# Patient Record
Sex: Male | Born: 2000 | Race: White | Hispanic: No | Marital: Single | Smoking: Current some day smoker
Health system: Southern US, Community
[De-identification: ages and names within clinical notes are randomized; demographics above are authoritative.]

## PROBLEM LIST (undated history)

## (undated) VITALS — BP 121/85 | HR 105 | Temp 97.5°F | Resp 16 | Ht 66.0 in | Wt 152.0 lb

---
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
## 2021-04-25 ENCOUNTER — Encounter (HOSPITAL_COMMUNITY): Payer: Self-pay

## 2021-04-25 ENCOUNTER — Ambulatory Visit (HOSPITAL_COMMUNITY)
Admission: EM | Admit: 2021-04-25 | Discharge: 2021-04-25 | Disposition: A | Discharge status: Home / Self Care | Payer: BC Managed Care – PPO | Attending: Urgent Care | Admitting: Urgent Care

## 2021-04-25 ENCOUNTER — Other Ambulatory Visit: Payer: Self-pay

## 2021-04-25 DIAGNOSIS — R059 Cough, unspecified: Secondary | ICD-10-CM

## 2021-04-25 DIAGNOSIS — J029 Acute pharyngitis, unspecified: Secondary | ICD-10-CM | POA: Insufficient documentation

## 2021-04-25 DIAGNOSIS — J038 Acute tonsillitis due to other specified organisms: Secondary | ICD-10-CM | POA: Insufficient documentation

## 2021-04-25 MED ORDER — NAPROXEN 500 MG PO TABS: 500.0000 mg | ORAL_TABLET | Freq: Two times a day (BID) | ORAL | 0 refills | Status: DC

## 2021-04-25 MED ORDER — AMOXICILLIN-POT CLAVULANATE 875-125 MG PO TABS: 1.0000 | ORAL_TABLET | Freq: Two times a day (BID) | ORAL | 0 refills | Status: DC

## 2021-04-25 MED ORDER — ACETAMINOPHEN 325 MG PO TABS
ORAL_TABLET | ORAL | Status: AC
Start: 2021-04-25 — End: ?
  Filled 2021-04-25: qty 2

## 2021-04-25 MED ORDER — ACETAMINOPHEN 325 MG PO TABS
650.0000 mg | ORAL_TABLET | Freq: Once | ORAL | Status: AC
Start: 2021-04-25 — End: 2021-04-25
  Administered 2021-04-25: 650 mg via ORAL

## 2021-04-25 NOTE — ED Triage Notes (Signed)
Pt present sore throat with enlarge tonsils and white patches on his tonsils. Pt state the fever started two days ago with a cough and then he noticed having trouble swallowing.

## 2021-04-25 NOTE — ED Provider Notes (Addendum)
[facility]   MRN: [id number] DOB: Apr 23, 2001  Subjective:   Stanley Castro is a 20 y.o. male presenting for 2-day history of acute onset severe throat pain that is worsening, has difficulty swallowing and is coughing.  Patient reports seeing swollen tonsils, white spots as well.  Denies chest pain, shortness of breath, fever, body aches.  Does not want to be tested for COVID-19.  Denies history of frequent strep infections or tonsillitis.  No current facility-administered medications for this encounter.  Current Outpatient Medications:    amphetamine-dextroamphetamine (ADDERALL) 10 MG tablet, Take 1 tablet (10 mg total) by mouth 2 (two) times daily., Disp: 60 tablet, Rfl: 0   amphetamine-dextroamphetamine (ADDERALL) 10 MG tablet, Take 1 tablet (10 mg total) by mouth 2 (two) times daily. Ok to fill 03/18/12, Disp: 60 tablet, Rfl: 0   amphetamine-dextroamphetamine (ADDERALL) 10 MG tablet, Take 1 tablet (10 mg total) by mouth 2 (two) times daily., Disp: 60 tablet, Rfl: 0   amphetamine-dextroamphetamine (ADDERALL) 10 MG tablet, Take 1 tablet (10 mg total) by mouth 2 (two) times daily. To fill 06/29/12, Disp: 60 tablet, Rfl: 0   amphetamine-dextroamphetamine (ADDERALL) 10 MG tablet, Take 1 tablet (10 mg total) by mouth 2 (two) times daily. To fill 07/28/12, Disp: 60 tablet, Rfl: 0   guanFACINE 4 MG TB24, Take 1 tablet (4 mg total) by mouth daily., Disp: 30 tablet, Rfl: 5   Melatonin 1 MG CAPS, Take by mouth., Disp: , Rfl:    Methylphenidate HCl (RITALIN PO), Take 1 tablet by mouth daily., Disp: , Rfl:    traZODone (DESYREL) 100 MG tablet, Take 1 tablet (100 mg total) by mouth at bedtime., Disp: 30 tablet, Rfl: 5   No Known Allergies  History reviewed. No pertinent past medical history.   History reviewed. No pertinent surgical history.  Family History  Problem Relation Age of Onset   Healthy Mother    Healthy Father     Social History   Tobacco Use   Smoking  status: Some Days    Types: Cigarettes   Smokeless tobacco: Current    ROS   Objective:   Vitals: BP 136/89 (BP Location: Left Arm)   Pulse (!) 104   Temp 100.1 F (37.8 C) (Oral)   Resp 18   SpO2 100%   Physical Exam Constitutional:      General: He is not in acute distress.    Appearance: Normal appearance. He is well-developed and normal weight. He is not ill-appearing, toxic-appearing or diaphoretic.  HENT:     Head: Normocephalic and atraumatic.     Right Ear: Tympanic membrane, ear canal and external ear normal. There is no impacted cerumen.     Left Ear: Tympanic membrane, ear canal and external ear normal. There is no impacted cerumen.     Nose: Nose normal. No congestion or rhinorrhea.     Mouth/Throat:     Mouth: Mucous membranes are moist.     Pharynx: Pharyngeal swelling, oropharyngeal exudate and posterior oropharyngeal erythema present. No uvula swelling.     Tonsils: Tonsillar exudate present. No tonsillar abscesses. 2+ on the right. 1+ on the left.  Eyes:     General: No scleral icterus.       Right eye: No discharge.        Left eye: No discharge.     Extraocular Movements: Extraocular movements intact.     Conjunctiva/sclera: Conjunctivae normal.     Pupils: Pupils are equal, round, and  reactive to light.  Cardiovascular:     Rate and Rhythm: Normal rate and regular rhythm.     Heart sounds: Normal heart sounds. No murmur heard.   No friction rub. No gallop.  Pulmonary:     Effort: Pulmonary effort is normal. No respiratory distress.     Breath sounds: Normal breath sounds. No stridor. No wheezing, rhonchi or rales.  Musculoskeletal:     Cervical back: Normal range of motion and neck supple. No rigidity. No muscular tenderness.  Neurological:     General: No focal deficit present.     Mental Status: He is alert and oriented to person, place, and time.  Psychiatric:        Mood and Affect: Mood normal.        Behavior: Behavior normal.         Thought Content: Thought content normal.        Judgment: Judgment normal.    Negative rapid strep by verbal confirmation.  Patient given Tylenol for his fever.  Assessment and Plan :   PDMP not reviewed this encounter.  1. Acute tonsillitis due to other specified organisms   2. Sore throat   3. Cough     At this time I do not suspect tonsillar abscess, retropharyngeal abscess.  Airway is not compromised.  Will treat empirically for tonsillitis, pharyngitis given physical exam findings.  Patient is to start Augmentin, use supportive care otherwise. Counseled patient on potential for adverse effects with medications prescribed/recommended today, strict ER and return-to-clinic precautions discussed, patient verbalized understanding.     Wallis Bamberg, PA-C 04/25/21 1122

## 2021-04-27 LAB — POCT RAPID STREP A, ED / UC: Streptococcus, Group A Screen (Direct): NEGATIVE

## 2021-04-27 LAB — CULTURE, GROUP A STREP (THRC)

## 2021-05-08 ENCOUNTER — Ambulatory Visit (HOSPITAL_COMMUNITY)
Admission: EM | Admit: 2021-05-08 | Discharge: 2021-05-08 | Disposition: A | Discharge status: Home / Self Care | Payer: BC Managed Care – PPO | Attending: Medical Oncology | Admitting: Medical Oncology

## 2021-05-08 ENCOUNTER — Encounter (HOSPITAL_COMMUNITY): Payer: Self-pay | Admitting: *Deleted

## 2021-05-08 ENCOUNTER — Other Ambulatory Visit: Payer: Self-pay

## 2021-05-08 DIAGNOSIS — Z23 Encounter for immunization: Secondary | ICD-10-CM | POA: Diagnosis not present

## 2021-05-08 DIAGNOSIS — S61412A Laceration without foreign body of left hand, initial encounter: Secondary | ICD-10-CM | POA: Diagnosis not present

## 2021-05-08 MED ORDER — TETANUS-DIPHTH-ACELL PERTUSSIS 5-2.5-18.5 LF-MCG/0.5 IM SUSY
PREFILLED_SYRINGE | INTRAMUSCULAR | Status: AC
Start: 2021-05-08 — End: ?
  Filled 2021-05-08: qty 0.5

## 2021-05-08 MED ORDER — TETANUS-DIPHTHERIA TOXOIDS TD 5-2 LFU IM INJ: 0.5000 mL | INJECTION | Freq: Once | INTRAMUSCULAR | Status: DC

## 2021-05-08 MED ORDER — LIDOCAINE-EPINEPHRINE 1 %-1:100000 IJ SOLN
INTRAMUSCULAR | Status: AC
  Filled 2021-05-08: qty 1

## 2021-05-08 MED ORDER — DOXYCYCLINE HYCLATE 100 MG PO CAPS: 100.0000 mg | ORAL_CAPSULE | Freq: Two times a day (BID) | ORAL | 0 refills | Status: DC

## 2021-05-08 MED ORDER — TETANUS-DIPHTH-ACELL PERTUSSIS 5-2.5-18.5 LF-MCG/0.5 IM SUSY
0.5000 mL | PREFILLED_SYRINGE | Freq: Once | INTRAMUSCULAR | Status: AC
  Administered 2021-05-08: 0.5 mL via INTRAMUSCULAR

## 2021-05-08 NOTE — Discharge Instructions (Addendum)
Please return in 7 days for suture removal

## 2021-05-08 NOTE — ED Triage Notes (Signed)
Pt cut his Lt hand with box cutter today.

## 2021-05-08 NOTE — ED Provider Notes (Signed)
[facility]    CSN: [id number] Arrival date & time: 05/08/21  1642      History   Chief Complaint Chief Complaint  Patient presents with   Laceration    HPI Stanley Castro is a 20 y.o. male.   HPI  Laceration: Patient reports that about an hour ago he was cutting a box and accidentally cut his left hand with a box cutter.  He states that he did not cut very deep.  There was mild bleeding that lasted about 5 minutes and was able to be continued with pressure.  He denies any numbness, tingling or skin color changes.  He is unsure if he is up-to-date on his Tdap or not.  History reviewed. No pertinent past medical history.  Patient Active Problem List   Diagnosis Date Noted   ADHD (attention deficit hyperactivity disorder) 05/30/2012    History reviewed. No pertinent surgical history.     Home Medications    Prior to Admission medications   Medication Sig Start Date End Date Taking? Authorizing Provider  guanFACINE 4 MG TB24 Take 1 tablet (4 mg total) by mouth daily. 01/19/12  Yes Copland, Gwenlyn Found, MD  Melatonin 1 MG CAPS Take by mouth.   Yes [provider]  naproxen (NAPROSYN) 500 MG tablet Take 1 tablet (500 mg total) by mouth 2 (two) times daily with a meal. 04/25/21  Yes Wallis Bamberg, PA-C  amoxicillin-clavulanate (AUGMENTIN) 875-125 MG tablet Take 1 tablet by mouth every 12 (twelve) hours. 04/25/21   Wallis Bamberg, PA-C  amphetamine-dextroamphetamine (ADDERALL) 10 MG tablet Take 1 tablet (10 mg total) by mouth 2 (two) times daily. 01/19/12   Copland, Gwenlyn Found, MD  amphetamine-dextroamphetamine (ADDERALL) 10 MG tablet Take 1 tablet (10 mg total) by mouth 2 (two) times daily. Ok to fill 03/18/12 01/19/12   Copland, Gwenlyn Found, MD  amphetamine-dextroamphetamine (ADDERALL) 10 MG tablet Take 1 tablet (10 mg total) by mouth 2 (two) times daily. 05/30/12   Copland, Gwenlyn Found, MD  amphetamine-dextroamphetamine (ADDERALL) 10 MG tablet Take 1 tablet (10 mg total) by  mouth 2 (two) times daily. To fill 06/29/12 05/30/12   Copland, Gwenlyn Found, MD  amphetamine-dextroamphetamine (ADDERALL) 10 MG tablet Take 1 tablet (10 mg total) by mouth 2 (two) times daily. To fill 07/28/12 05/30/12   Copland, Gwenlyn Found, MD  Methylphenidate HCl (RITALIN PO) Take 1 tablet by mouth daily.    [provider]  traZODone (DESYREL) 100 MG tablet Take 1 tablet (100 mg total) by mouth at bedtime. 01/19/12   Copland, Gwenlyn Found, MD    Family History Family History  Problem Relation Age of Onset   Healthy Mother    Healthy Father     Social History Social History   Tobacco Use   Smoking status: Some Days    Types: Cigarettes   Smokeless tobacco: Current     Allergies   Patient has no known allergies.   Review of Systems Review of Systems  As stated above in HPI Physical Exam Triage Vital Signs ED Triage Vitals  Enc Vitals Group     BP 05/08/21 1702 110/65     Pulse Rate 05/08/21 1702 82     Resp 05/08/21 1702 18     Temp 05/08/21 1702 98.4 F (36.9 C)     Temp src --      SpO2 05/08/21 1702 98 %     Weight --      Height --  Head Circumference --      Peak Flow --      Pain Score 05/08/21 1658 3     Pain Loc --      Pain Edu? --      Excl. in GC? --    No data found.  Updated Vital Signs BP 110/65   Pulse 82   Temp 98.4 F (36.9 C)   Resp 18   SpO2 98%   Physical Exam Vitals and nursing note reviewed.  Cardiovascular:     Pulses: Normal pulses.  Musculoskeletal:        General: Normal range of motion.  Skin:    General: Skin is warm.     Capillary Refill: Capillary refill takes less than 2 seconds.     Comments: 1 inch laceration of the anatomical snuffbox.   Neurological:     Cranial Nerves: No cranial nerve deficit.     Sensory: No sensory deficit.     Motor: No weakness.     Coordination: Coordination normal.     UC Treatments / Results  Labs (all labs ordered are listed, but only abnormal results are displayed) Labs  Reviewed - No data to display  EKG   Radiology No results found.  Procedures Laceration Repair  Date/Time: 05/08/2021 5:55 PM Performed by: Rushie Chestnut, PA-C Authorized by: Rushie Chestnut, PA-C   Consent:    Consent obtained:  Verbal   Consent given by:  Patient   Risks discussed:  Infection, need for additional repair, pain, poor cosmetic result and poor wound healing   Alternatives discussed:  No treatment and delayed treatment Universal protocol:    Procedure explained and questions answered to patient or proxy's satisfaction: yes     Relevant documents present and verified: yes     Test results available: yes     Imaging studies available: yes     Required blood products, implants, devices, and special equipment available: yes     Site/side marked: yes     Immediately prior to procedure, a time out was called: yes     Patient identity confirmed:  Verbally with patient Anesthesia:    Anesthesia method:  Local infiltration   Local anesthetic:  Lidocaine 1% WITH epi Laceration details:    Location:  Hand   Hand location:  L hand, dorsum   Length (cm):  10   Depth (mm):  2 Pre-procedure details:    Preparation:  Patient was prepped and draped in usual sterile fashion Exploration:    Limited defect created (wound extended): yes     Hemostasis achieved with:  Direct pressure   Imaging outcome: foreign body not noted     Wound exploration: wound explored through full range of motion     Wound extent: fascia violated     Wound extent: no areolar tissue violation noted, no foreign bodies/material noted, no muscle damage noted, no nerve damage noted, no tendon damage noted, no underlying fracture noted and no vascular damage noted     Contaminated: no   Treatment:    Area cleansed with:  Shur-Clens   Amount of cleaning:  Standard   Irrigation solution:  Sterile saline   Debridement:  None   Undermining:  None   Scar revision: no     Layers repaired:  subcutaneous. Skin repair:    Repair method:  Sutures   Suture size:  5-0   Suture material:  Nylon   Suture technique:  Simple interrupted   Number  of sutures:  3 Approximation:    Approximation:  Close Repair type:    Repair type:  Simple Post-procedure details:    Dressing:  Bulky dressing   Procedure completion:  Tolerated well, no immediate complications (including critical care time)  Medications Ordered in UC Medications - No data to display  Initial Impression / Assessment and Plan / UC Course  I have reviewed the triage vital signs and the nursing notes.  Pertinent labs & imaging results that were available during my care of the patient were reviewed by me and considered in my medical decision making (see chart for details).     New.  As this was a dirty wound on the hand we are going to treat with antibiotics to prevent subsequent infection.  Repaired with 3 sutures.  Discussed wound care.  Tdap to be given.  Discussed suture removal in 7 days.  Discussed red flag signs and symptoms.  Follow-up.. Final Clinical Impressions(s) / UC Diagnoses   Final diagnoses:  None   Discharge Instructions   None    ED Prescriptions   None    PDMP not reviewed this encounter.   Rushie Chestnut, [place] 05/08/21 1757

## 2021-09-20 ENCOUNTER — Emergency Department (HOSPITAL_COMMUNITY)
Admission: EM | Admit: 2021-09-20 | Discharge: 2021-09-21 | Disposition: A | Discharge status: Home / Self Care | Payer: BC Managed Care – PPO | Attending: Emergency Medicine | Admitting: Emergency Medicine

## 2021-09-20 ENCOUNTER — Encounter (HOSPITAL_COMMUNITY): Payer: Self-pay | Admitting: Emergency Medicine

## 2021-09-20 ENCOUNTER — Other Ambulatory Visit: Payer: Self-pay

## 2021-09-20 DIAGNOSIS — R531 Weakness: Secondary | ICD-10-CM | POA: Insufficient documentation

## 2021-09-20 DIAGNOSIS — R42 Dizziness and giddiness: Secondary | ICD-10-CM | POA: Diagnosis not present

## 2021-09-20 DIAGNOSIS — R55 Syncope and collapse: Secondary | ICD-10-CM | POA: Insufficient documentation

## 2021-09-20 DIAGNOSIS — Z5321 Procedure and treatment not carried out due to patient leaving prior to being seen by health care provider: Secondary | ICD-10-CM | POA: Insufficient documentation

## 2021-09-20 LAB — CBC WITH DIFFERENTIAL/PLATELET
Abs Immature Granulocytes: 0.04 10*3/uL (ref 0.00–0.07)
Basophils Absolute: 0 10*3/uL (ref 0.0–0.1)
Basophils Relative: 0 %
Eosinophils Absolute: 0 10*3/uL (ref 0.0–0.5)
Eosinophils Relative: 0 %
HCT: 43.4 % (ref 39.0–52.0)
Hemoglobin: 14.5 g/dL (ref 13.0–17.0)
Immature Granulocytes: 0 %
Lymphocytes Relative: 11 %
Lymphs Abs: 1.1 10*3/uL (ref 0.7–4.0)
MCH: 28.3 pg (ref 26.0–34.0)
MCHC: 33.4 g/dL (ref 30.0–36.0)
MCV: 84.8 fL (ref 80.0–100.0)
Monocytes Absolute: 0.5 10*3/uL (ref 0.1–1.0)
Monocytes Relative: 5 %
Neutro Abs: 8.6 10*3/uL — ABNORMAL HIGH (ref 1.7–7.7)
Neutrophils Relative %: 84 %
Platelets: 304 10*3/uL (ref 150–400)
RBC: 5.12 MIL/uL (ref 4.22–5.81)
RDW: 13.9 % (ref 11.5–15.5)
WBC: 10.3 10*3/uL (ref 4.0–10.5)
nRBC: 0 % (ref 0.0–0.2)

## 2021-09-20 LAB — BASIC METABOLIC PANEL
Anion gap: 5 (ref 5–15)
BUN: 14 mg/dL (ref 6–20)
CO2: 24 mmol/L (ref 22–32)
Calcium: 9.4 mg/dL (ref 8.9–10.3)
Chloride: 103 mmol/L (ref 98–111)
Creatinine, Ser: 0.92 mg/dL (ref 0.61–1.24)
GFR, Estimated: >60 mL/min (ref >60)
Glucose, Bld: 116 mg/dL — ABNORMAL HIGH (ref 70–99)
Potassium: 4.1 mmol/L (ref 3.5–5.1)
Sodium: 132 mmol/L — ABNORMAL LOW (ref 135–145)

## 2021-09-20 NOTE — ED Triage Notes (Signed)
Brother reports pt was filling out his paperwork for his first day of work at [employer] and he had a syncopal episode and hit head against wall.  Pt denies pain.  Reports mild dizziness prior to syncopal episode.  Reports generalized weakness.

## 2021-09-20 NOTE — ED Notes (Signed)
PT stating that he's feeling better and leaving at this time.

## 2021-09-20 NOTE — ED Provider Notes (Signed)
Emergency Medicine Provider Triage Evaluation Note  Stanley Castro , a 20 y.o. male  was evaluated in triage.  Pt complains of passing out.  Pt reports he had not ate today.  Pt reports he became lightheaded and blacked out.   Review of Systems  Positive: weakness Negative: fever  Physical Exam  BP 125/77 (BP Location: Left Arm)    Pulse (!) 103    Temp 97.6 F (36.4 C) (Oral)    Resp 14    SpO2 99%  Gen:   Awake, no distress   Resp:  Normal effort  MSK:   Moves extremities without difficulty  Other:    Medical Decision Making  Medically screening exam initiated at 6:39 PM.  Appropriate orders placed.  Stanley Castro was informed that the remainder of the evaluation will be completed by another provider, this initial triage assessment does not replace that evaluation, and the importance of remaining in the ED until their evaluation is complete.     Elson Areas, [place] 09/20/21 1840    Vanetta Mulders, MD 09/23/21 [id number]

## 2021-10-14 ENCOUNTER — Emergency Department (HOSPITAL_COMMUNITY)
Admission: EM | Admit: 2021-10-14 | Discharge: 2021-10-14 | Disposition: A | Discharge status: Home / Self Care | Payer: BC Managed Care – PPO | Attending: Emergency Medicine | Admitting: Emergency Medicine

## 2021-10-14 ENCOUNTER — Other Ambulatory Visit: Payer: Self-pay

## 2021-10-14 ENCOUNTER — Emergency Department (HOSPITAL_COMMUNITY): Payer: BC Managed Care – PPO

## 2021-10-14 DIAGNOSIS — L040 Acute lymphadenitis of face, head and neck: Secondary | ICD-10-CM | POA: Insufficient documentation

## 2021-10-14 DIAGNOSIS — J039 Acute tonsillitis, unspecified: Secondary | ICD-10-CM | POA: Insufficient documentation

## 2021-10-14 DIAGNOSIS — R110 Nausea: Secondary | ICD-10-CM | POA: Diagnosis not present

## 2021-10-14 DIAGNOSIS — J029 Acute pharyngitis, unspecified: Secondary | ICD-10-CM | POA: Diagnosis present

## 2021-10-14 LAB — COMPREHENSIVE METABOLIC PANEL
ALT: 15 U/L (ref 0–44)
AST: 17 U/L (ref 15–41)
Albumin: 3.5 g/dL (ref 3.5–5.0)
Alkaline Phosphatase: 93 U/L (ref 38–126)
Anion gap: 8 (ref 5–15)
BUN: 10 mg/dL (ref 6–20)
CO2: 22 mmol/L (ref 22–32)
Calcium: 9.2 mg/dL (ref 8.9–10.3)
Chloride: 106 mmol/L (ref 98–111)
Creatinine, Ser: 0.76 mg/dL (ref 0.61–1.24)
GFR, Estimated: >60 mL/min (ref >60)
Glucose, Bld: 99 mg/dL (ref 70–99)
Potassium: 3.8 mmol/L (ref 3.5–5.1)
Sodium: 136 mmol/L (ref 135–145)
Total Bilirubin: 0.6 mg/dL (ref 0.3–1.2)
Total Protein: 8 g/dL (ref 6.5–8.1)

## 2021-10-14 LAB — CBC WITH DIFFERENTIAL/PLATELET
Abs Immature Granulocytes: 0.02 10*3/uL (ref 0.00–0.07)
Basophils Absolute: 0.1 10*3/uL (ref 0.0–0.1)
Basophils Relative: 1 %
Eosinophils Absolute: 0.2 10*3/uL (ref 0.0–0.5)
Eosinophils Relative: 2 %
HCT: 43.1 % (ref 39.0–52.0)
Hemoglobin: 14.5 g/dL (ref 13.0–17.0)
Immature Granulocytes: 0 %
Lymphocytes Relative: 25 %
Lymphs Abs: 2 10*3/uL (ref 0.7–4.0)
MCH: 28 pg (ref 26.0–34.0)
MCHC: 33.6 g/dL (ref 30.0–36.0)
MCV: 83.4 fL (ref 80.0–100.0)
Monocytes Absolute: 0.6 10*3/uL (ref 0.1–1.0)
Monocytes Relative: 7 %
Neutro Abs: 5.2 10*3/uL (ref 1.7–7.7)
Neutrophils Relative %: 65 %
Platelets: 336 10*3/uL (ref 150–400)
RBC: 5.17 MIL/uL (ref 4.22–5.81)
RDW: 13.6 % (ref 11.5–15.5)
WBC: 8 10*3/uL (ref 4.0–10.5)
nRBC: 0 % (ref 0.0–0.2)

## 2021-10-14 LAB — GROUP A STREP BY PCR: Group A Strep by PCR: NOT DETECTED

## 2021-10-14 MED ORDER — PREDNISONE 20 MG PO TABS
40.0000 mg | ORAL_TABLET | Freq: Every day | ORAL | 0 refills | Status: AC
Start: 2021-10-14 — End: 2021-10-19

## 2021-10-14 MED ORDER — DEXAMETHASONE SODIUM PHOSPHATE 10 MG/ML IJ SOLN
10.0000 mg | Freq: Once | INTRAMUSCULAR | Status: AC
  Administered 2021-10-14: 10 mg via INTRAVENOUS
  Filled 2021-10-14: qty 1

## 2021-10-14 MED ORDER — IOHEXOL 300 MG/ML  SOLN
75.0000 mL | Freq: Once | INTRAMUSCULAR | Status: AC | PRN
  Administered 2021-10-14: 75 mL via INTRAVENOUS

## 2021-10-14 MED ORDER — ONDANSETRON HCL 4 MG PO TABS
4.0000 mg | ORAL_TABLET | Freq: Four times a day (QID) | ORAL | 0 refills | Status: DC
Start: 2021-10-14 — End: 2022-02-25

## 2021-10-14 MED ORDER — AMOXICILLIN-POT CLAVULANATE 875-125 MG PO TABS
1.0000 | ORAL_TABLET | Freq: Two times a day (BID) | ORAL | 0 refills | Status: DC
Start: 2021-10-14 — End: 2022-02-25

## 2021-10-14 NOTE — ED Provider Notes (Signed)
[facility] EMERGENCY DEPARTMENT Provider Note   CSN: [id number] Arrival date & time: 10/14/21  0309     History  Chief Complaint  Patient presents with   Oral Swelling    Stanley Castro is a 21 y.o. male who presents for evaluation of sore throat swollen tonsils for 1 week that was acutely exacerbated last night.  Patient states that he had URI approximately 3 to 4 weeks ago but otherwise has been feeling fine.  Over the last week, his right tonsil is more tender than the left tonsil and is affecting his right ear.  He has been taking over-the-counter medications without any relief.  He also endorses intermittent nausea.  Patient states he has similar episode in July 2022 that required antibiotics.  Patient was seen in triage and was given Decadron 10 mg IV.  Additionally CT of the neck with basic labs was ordered out of concern for possible peritonsillar abscess.  Currently he denies shortness of breath or difficulty swallowing due to swelling, however he states that his tonsils are significantly better after the dose of Decadron earlier this morning.  He denies chest pain, fever, vomiting or diarrhea.  HPI     Home Medications Prior to Admission medications   Medication Sig Start Date End Date Taking? Authorizing Provider  amoxicillin-clavulanate (AUGMENTIN) 875-125 MG tablet Take 1 tablet by mouth every 12 (twelve) hours. 10/14/21  Yes Raynald Blend R, PA-C  cloNIDine (CATAPRES) 0.1 MG tablet Take 0.1 mg by mouth See admin instructions. 1/2 tab in the am and 1 tab at bedtime 10/07/21  Yes [provider]  guanFACINE 4 MG TB24 Take 1 tablet (4 mg total) by mouth daily. 01/19/12  Yes Copland, Gwenlyn Found, MD  Melatonin 1 MG CAPS Take 1 mg by mouth at bedtime as needed (sleep).   Yes [provider]  ondansetron (ZOFRAN) 4 MG tablet Take 1 tablet (4 mg total) by mouth every 6 (six) hours. 10/14/21  Yes Janell Quiet, PA-C  Oxcarbazepine (TRILEPTAL) 300  MG tablet Take 300 mg by mouth 2 (two) times daily. 09/30/21  Yes [provider]  predniSONE (DELTASONE) 20 MG tablet Take 2 tablets (40 mg total) by mouth daily for 5 days. 10/14/21 10/19/21 Yes Janell Quiet, PA-C  traZODone (DESYREL) 100 MG tablet Take 1 tablet (100 mg total) by mouth at bedtime. Patient not taking: Reported on 10/14/2021 01/19/12   Copland, Gwenlyn Found, MD      Allergies    Patient has no known allergies.    Review of Systems   Review of Systems  Physical Exam Updated Vital Signs BP 126/63    Pulse 93    Temp 99.1 F (37.3 C) (Oral)    Resp 15    SpO2 97%  Physical Exam Vitals and nursing note reviewed.  Constitutional:      General: He is not in acute distress.    Appearance: He is not ill-appearing.  HENT:     Head: Atraumatic.     Right Ear: Tympanic membrane and ear canal normal.     Left Ear: Tympanic membrane and ear canal normal.     Mouth/Throat:     Pharynx: Posterior oropharyngeal erythema present.     Comments: Significant bilateral tonsillar swelling and erythema slightly worse on right side.  Appears to be several craters consistent with expel tonsil stones.  Airway is intact. Eyes:     Conjunctiva/sclera: Conjunctivae normal.  Cardiovascular:     Rate  and Rhythm: Normal rate and regular rhythm.     Pulses: Normal pulses.     Heart sounds: No murmur heard. Pulmonary:     Effort: Pulmonary effort is normal. No respiratory distress.     Breath sounds: Normal breath sounds.  Abdominal:     General: Abdomen is flat. There is no distension.     Palpations: Abdomen is soft.     Tenderness: There is no abdominal tenderness.  Musculoskeletal:        General: Normal range of motion.     Cervical back: Normal range of motion.  Skin:    General: Skin is warm and dry.     Capillary Refill: Capillary refill takes less than 2 seconds.  Neurological:     General: No focal deficit present.     Mental Status: He is alert.  Psychiatric:         Mood and Affect: Mood normal.    ED Results / Procedures / Treatments   Labs (all labs ordered are listed, but only abnormal results are displayed) Labs Reviewed  GROUP A STREP BY PCR  COMPREHENSIVE METABOLIC PANEL  CBC WITH DIFFERENTIAL/PLATELET    EKG None  Radiology CT Soft Tissue Neck W Contrast  Result Date: 10/14/2021 CLINICAL DATA:  Possible right peritonsillar abscess EXAM: CT NECK WITH CONTRAST TECHNIQUE: Multidetector CT imaging of the neck was performed using the standard protocol following the bolus administration of intravenous contrast. RADIATION DOSE REDUCTION: This exam was performed according to the departmental dose-optimization program which includes automated exposure control, adjustment of the mA and/or kV according to patient size and/or use of iterative reconstruction technique. CONTRAST:  75mL OMNIPAQUE IOHEXOL 300 MG/ML  SOLN COMPARISON:  None. FINDINGS: Pharynx and larynx: Tonsillar thickening. No abscess or retropharyngeal edema. No laryngitis Salivary glands: No inflammation, mass, or stone. Thyroid: Normal. Lymph nodes: Enlarged and somewhat avidly enhancing lymph nodes in the bilateral jugular chain with mild heterogeneity but no liquefaction. Vascular: Unremarkable.  No venous thrombosis. Limited intracranial: Negative Visualized orbits: Limited coverage is negative Mastoids and visualized paranasal sinuses: Lobulated mucosal thickening in the inferior right maxillary sinus which may be odontogenic given right upper first molar periapical lucency. Skeleton: Negative Upper chest: Negative IMPRESSION: 1. Tonsillitis and cervical adenitis without abscess. 2. Right maxillary sinusitis which may be odontogenic. Electronically Signed   By: Tiburcio PeaJonathan  Watts M.D.   On: 10/14/2021 06:32    Procedures Procedures    Medications Ordered in ED Medications  dexamethasone (DECADRON) injection 10 mg (10 mg Intravenous Given 10/14/21 0358)  iohexol (OMNIPAQUE) 300 MG/ML  solution 75 mL (75 mLs Intravenous Contrast Given 10/14/21 [id number])    ED Course/ Medical Decision Making/ A&P                           Medical Decision Making  Stanley Castro is a 21 y.o. male who presents for evaluation of sore throat swollen tonsils for 1 week that was acutely exacerbated last night.  Patient states that he had URI approximately 3 to 4 weeks ago but otherwise has been feeling fine.  Over the last week, his right tonsil is more tender than the left tonsil and is affecting his right ear.  He has been taking over-the-counter medications without any relief.  He also endorses intermittent nausea.  Patient states he has similar episode in July 2022 that required antibiotics.  Patient was seen in triage and was given Decadron 10 mg IV.  Additionally CT of the neck with basic labs was ordered out of concern for possible peritonsillar abscess.  Currently he denies shortness of breath or difficulty swallowing due to swelling, however he states that his tonsils are significantly better after the dose of Decadron earlier this morning.  He denies chest pain, fever, vomiting or diarrhea. This patient presents to the ED for concern of tonsillar swelling, this involves an extensive number of treatment options, and is a complaint that carries with it a high risk of complications and morbidity.  The differential diagnosis includes mononucleosis infection, strep throat, laryngitis, epiglottitis, viral infection, peritonsillar abscess, retropharyngeal abscess, Ludwick's angina, caustic ingestion and neoplasm   Additional history obtained:  External records from outside source obtained and reviewed including previous discharge summaries from similar events   Lab Tests:  I Ordered, reviewed, and interpreted labs.  The pertinent results include: CMP, CBC and strep all normal.   Imaging Studies ordered:  I ordered imaging studies including CT of neck I independently visualized and interpreted  imaging which showed significant bilateral tonsillitis with cervical adenitis.  Incidentally also found some right maxillary sinusitis, possibly from a dental infection I agree with the radiologist interpretation    Medicines ordered and prescription drug management:  I ordered medication including Decadron 10 IV for peritonsillar swelling Reevaluation of the patient after these medicines showed that the patient improved I have reviewed the patients home medicines and have made adjustments as needed   Dispostion:  After consideration of the diagnostic results and the patients response to treatment feel that the patent would benefit from outpatient treatment. 1.  Tonsillitis with cervical adenitis: Patient with significant improvement in tonsillar swelling after IV steroids, although they are still quite enlarged bilaterally during my examination.  Rest of a physical exam was normal.  No immediate concern for airway compromise.  Patient has had this before and responds well with course of Augmentin, so I will send this in for him to start today.  Additionally have given him a short course of oral steroids to help with the swelling over the next few days.  Prescribed Zofran for nausea as needed.  Return precautions were discussed.  All questions asked and answered. Discharged home in good condition.   Final Clinical Impression(s) / ED Diagnoses Final diagnoses:  Tonsillitis  Acute cervical adenitis    Rx / DC Orders ED Discharge Orders          Ordered    amoxicillin-clavulanate (AUGMENTIN) 875-125 MG tablet  Every 12 hours        10/14/21 1009    predniSONE (DELTASONE) 20 MG tablet  Daily        10/14/21 1009    ondansetron (ZOFRAN) 4 MG tablet  Every 6 hours        10/14/21 1009              Janell Quiet, PA-C 10/14/21 1249    Virgina Norfolk, DO 10/14/21 1602

## 2021-10-14 NOTE — ED Provider Triage Note (Signed)
Emergency Medicine Provider Triage Evaluation Note  Stanley Castro , a 21 y.o. male  was evaluated in triage.  Pt complains of sore throat.  Symptoms started about 3 days ago and have been worsening.  Reports pain with swallowing.  No shortness of breath or chest pain.  States he has a history of tonsillitis in the past.  Physical Exam  There were no vitals taken for this visit. Gen:   Awake, no distress   Resp:  Normal effort  MSK:   Moves extremities without difficulty  Other:  Bilateral tonsillar hypertrophy, right greater than left.  Uvular deviation to the right.  Handling secretions.  No stridor.  No hot potato voice.  Medical Decision Making  Medically screening exam initiated at 3:47 AM.  Appropriate orders placed.  Stanley Castro was informed that the remainder of the evaluation will be completed by another provider, this initial triage assessment does not replace that evaluation, and the importance of remaining in the ED until their evaluation is complete.     Placido Sou, PA-C 10/14/21 [phone]

## 2021-10-14 NOTE — Discharge Instructions (Addendum)
Your CT today shows you have acute tonsillitis with some cervical lymph node inflammation.  Fortunately there is no signs of an abscess, however given the severity of your tonsillitis, I will send you home with a course of Augmentin which seems to work for you before.  I have also sent you a short course of steroids that you can start tonight and take once daily for 5 days.  Since you have been intermittently nauseous, I have also sent you in a prescription for Zofran which you can placed under your tongue when you feel nauseous.  You should feel better in the next 24 to 48 hours.  You can continue using Tylenol and Motrin as needed for pain and fevers.  I have also given you an ENT referral since you seem to have recurrent tonsillitis.    There was some evidence of inflammation of the right maxillary sinus cavity as well, although this does not seem to be bothering him.  CT reading was suspicious that this may be secondary to a dental cause.  Please follow-up with your dentist for cleaning and evaluation.  Please return to the ED if swelling worsens to the point where you feel that you cannot swallow or breathe properly.

## 2021-10-14 NOTE — ED Triage Notes (Signed)
Pt c/o tonsils swelling x1 weeks. Difficulty eating and drinking.

## 2021-12-23 ENCOUNTER — Other Ambulatory Visit: Payer: Self-pay

## 2021-12-23 ENCOUNTER — Encounter (HOSPITAL_BASED_OUTPATIENT_CLINIC_OR_DEPARTMENT_OTHER): Payer: Self-pay

## 2021-12-23 ENCOUNTER — Emergency Department (HOSPITAL_BASED_OUTPATIENT_CLINIC_OR_DEPARTMENT_OTHER)
Admission: EM | Admit: 2021-12-23 | Discharge: 2021-12-23 | Disposition: A | Discharge status: Home / Self Care | Payer: BC Managed Care – PPO | Attending: Emergency Medicine | Admitting: Emergency Medicine

## 2021-12-23 DIAGNOSIS — Z20822 Contact with and (suspected) exposure to covid-19: Secondary | ICD-10-CM | POA: Insufficient documentation

## 2021-12-23 DIAGNOSIS — J020 Streptococcal pharyngitis: Secondary | ICD-10-CM | POA: Insufficient documentation

## 2021-12-23 DIAGNOSIS — J029 Acute pharyngitis, unspecified: Secondary | ICD-10-CM | POA: Diagnosis present

## 2021-12-23 LAB — GROUP A STREP BY PCR: Group A Strep by PCR: DETECTED — AB

## 2021-12-23 LAB — RESP PANEL BY RT-PCR (FLU A&B, COVID) ARPGX2
Influenza A by PCR: NEGATIVE
Influenza B by PCR: NEGATIVE
SARS Coronavirus 2 by RT PCR: NEGATIVE

## 2021-12-23 MED ORDER — PENICILLIN G BENZATHINE 1200000 UNIT/2ML IM SUSY
1.2000 10*6.[IU] | PREFILLED_SYRINGE | Freq: Once | INTRAMUSCULAR | Status: AC
  Administered 2021-12-23: 1.2 10*6.[IU] via INTRAMUSCULAR
  Filled 2021-12-23: qty 2

## 2021-12-23 MED ORDER — DEXAMETHASONE SODIUM PHOSPHATE 10 MG/ML IJ SOLN
10.0000 mg | Freq: Once | INTRAMUSCULAR | Status: AC
  Administered 2021-12-23: 10 mg via INTRAMUSCULAR
  Filled 2021-12-23: qty 1

## 2021-12-23 NOTE — Discharge Instructions (Signed)
You have tested positive for strep at this time.  We have provided you with an antibiotic injection in the ED as well as a steroid injection due to the amount of swelling in your throat. ? ?It is recommended that you continue taking ibuprofen and Tylenol as needed for pain and fever. ? ?Continue drinking plenty of fluids to stay hydrated.  Eat softer foods to help soothe your throat. ? ?Return to the ED immediately for any new/worsening symptoms including worsening pain, worsening swelling, inability to swallow liquids or solids, drooling on yourself, inability to swallow your own saliva, muffled voice, any other new/concerning symptoms.  ?

## 2021-12-23 NOTE — ED Provider Notes (Signed)
?[facility] EMERGENCY DEPT ?Provider Note ? ? ?CSN: [id number] ?Arrival date & time: 12/23/21  1704 ? ?  ? ?History ? ?Chief Complaint  ?Patient presents with  ? Sore Throat  ? ? ?Stanley Castro is a 21 y.o. male who presents to the ED today with complaint of gradual onset, constant, achy, sore throat that began this morning.  Patient reports history of recent sick contact with individual who tested positive for strep.  He also reports low-grade fever of 99.0.  He is able to tolerate liquids and solids without difficulty.  Denies any drooling.  No voice change.  No other complaints at this time. ? ?The history is provided by the patient and medical records.  ? ?  ? ?Home Medications ?Prior to Admission medications   ?Medication Sig Start Date End Date Taking? Authorizing Provider  ?amoxicillin-clavulanate (AUGMENTIN) 875-125 MG tablet Take 1 tablet by mouth every 12 (twelve) hours. 10/14/21   Janell Quiet, PA-C  ?cloNIDine (CATAPRES) 0.1 MG tablet Take 0.1 mg by mouth See admin instructions. 1/2 tab in the am and 1 tab at bedtime 10/07/21   [provider]  ?guanFACINE 4 MG TB24 Take 1 tablet (4 mg total) by mouth daily. 01/19/12   Copland, Gwenlyn Found, MD  ?Melatonin 1 MG CAPS Take 1 mg by mouth at bedtime as needed (sleep).    [provider]  ?ondansetron (ZOFRAN) 4 MG tablet Take 1 tablet (4 mg total) by mouth every 6 (six) hours. 10/14/21   Janell Quiet, PA-C  ?Oxcarbazepine (TRILEPTAL) 300 MG tablet Take 300 mg by mouth 2 (two) times daily. 09/30/21   [provider]  ?traZODone (DESYREL) 100 MG tablet Take 1 tablet (100 mg total) by mouth at bedtime. ?Patient not taking: Reported on 10/14/2021 01/19/12   Copland, Gwenlyn Found, MD  ?   ? ?Allergies    ?Patient has no known allergies.   ? ?Review of Systems   ?Review of Systems  ?Constitutional:  Positive for fever. Negative for chills.  ?HENT:  Positive for sore throat. Negative for trouble swallowing and voice change.    ?Respiratory:  Negative for cough.   ?Musculoskeletal:  Positive for myalgias.  ?All other systems reviewed and are negative. ? ?Physical Exam ?Updated Vital Signs ?BP 127/83 (BP Location: Right Arm)   Pulse (!) 111   Temp 99.1 ?F (37.3 ?C) (Oral)   Resp 12   Ht 5\' 6"  (1.676 m)   Wt 66.7 kg   SpO2 97%   BMI 23.73 kg/m?  ?Physical Exam ?Vitals and nursing note reviewed.  ?Constitutional:   ?   Appearance: He is not ill-appearing.  ?HENT:  ?   Head: Normocephalic and atraumatic.  ?   Mouth/Throat:  ?   Pharynx: Uvula midline. Pharyngeal swelling, oropharyngeal exudate and posterior oropharyngeal erythema present.  ?   Comments: Phonating normally.  Tolerating own secretions without difficulty. ?Eyes:  ?   Conjunctiva/sclera: Conjunctivae normal.  ?Cardiovascular:  ?   Rate and Rhythm: Normal rate and regular rhythm.  ?Pulmonary:  ?   Effort: Pulmonary effort is normal.  ?   Breath sounds: Normal breath sounds.  ?Abdominal:  ?   Palpations: Abdomen is soft.  ?   Tenderness: There is no abdominal tenderness.  ?Musculoskeletal:  ?   Cervical back: Neck supple.  ?Skin: ?   General: Skin is warm and dry.  ?Neurological:  ?   Mental Status: He is alert.  ? ? ?ED Results / Procedures /  Treatments   ?Labs ?(all labs ordered are listed, but only abnormal results are displayed) ?Labs Reviewed  ?GROUP A STREP BY PCR - Abnormal; Notable for the following components:  ?    Result Value  ? Group A Strep by PCR DETECTED (*)   ? All other components within normal limits  ?RESP PANEL BY RT-PCR (FLU A&B, COVID) ARPGX2  ? ? ?EKG ?None ? ?Radiology ?No results found. ? ?Procedures ?Procedures  ? ? ?Medications Ordered in ED ?Medications  ?dexamethasone (DECADRON) injection 10 mg (has no administration in time range)  ?penicillin g benzathine (BICILLIN LA) 1200000 UNIT/2ML injection 1.2 Million Units (has no administration in time range)  ? ? ?ED Course/ Medical Decision Making/ A&P ?  ?                        ?Medical Decision  Making ?21 year old male who presents to the ED today with complaint of sore throat that began this morning after recent sick contact with strep positive individual.  On arrival he is afebrile with a temperature of 99.1.  He is mildly tachycardic at 111.  Remainder vitals are unremarkable.  He had COVID, flu, strep testing done prior to being seen.  COVID and flu are negative however if strep is positive at this time.  On exam he has 2+ tonsillar hypertrophy bilaterally with exudate and erythema.  Uvula is midline.  He is phonating normally and tolerating his own secretions without difficulty.  No concern for PTA at this time.  Given amount of swelling we will plan for Decadron.  Had shared decision-making with patient regarding choice of antibiotic, IM versus oral.  Patient would like IM dose here in the ED today.  No allergies to penicillins.  Provided with same.  He is recommended to continue ibuprofen Tylenol as needed for pain and PCP follow-up.  Strict return precautions have been discussed with him including worsening pain, worsening swelling, inability to swallow liquids or solids, drooling, voice change, any other associated symptoms.  Patient is in agreement plan and stable for discharge. ? ?Problems Addressed: ?Strep pharyngitis: acute illness or injury ? ?Amount and/or Complexity of Data Reviewed ?Labs: ordered. Decision-making details documented in ED Course. ? ? ? ? ? ? ? ? ? ?Final Clinical Impression(s) / ED Diagnoses ?Final diagnoses:  ?Strep pharyngitis  ? ? ?Rx / DC Orders ?ED Discharge Orders   ? ? None  ? ?  ? ? ? ?Discharge Instructions   ? ?  ?You have tested positive for strep at this time.  We have provided you with an antibiotic injection in the ED as well as a steroid injection due to the amount of swelling in your throat. ? ?It is recommended that you continue taking ibuprofen and Tylenol as needed for pain and fever. ? ?Continue drinking plenty of fluids to stay hydrated.  Eat softer  foods to help soothe your throat. ? ?Return to the ED immediately for any new/worsening symptoms including worsening pain, worsening swelling, inability to swallow liquids or solids, drooling on yourself, inability to swallow your own saliva, muffled voice, any other new/concerning symptoms.  ? ? ? ? ? ?  ?Tanda Rockers, PA-C ?12/23/21 1908 ? ?  ?Jacalyn Lefevre, MD ?12/23/21 2304 ? ?

## 2021-12-23 NOTE — ED Notes (Signed)
Patient given discharge instructions. Questions were answered. Patient verbalized understanding of discharge instructions and care at home. ? ?Patient discharged with friend ?

## 2021-12-23 NOTE — ED Triage Notes (Signed)
Patient here POV from Home with Sore Throat. ? ?Patient had recent contact with another person with Strep. Patient has been having a Sore Throat since this AM. Associated with Body Aches,  ? ?No Known Fevers. No N/V/D.  ? ?Notable Swelling to Throat. Oral Airway Intact at this Time. Patient speaking in complete sentences.  ? ?NAD Noted during Triage. A&Ox4. Gcs 15. Ambulatory.  ?

## 2022-02-23 ENCOUNTER — Other Ambulatory Visit: Payer: Self-pay

## 2022-02-23 ENCOUNTER — Encounter (HOSPITAL_COMMUNITY): Payer: Self-pay

## 2022-02-23 ENCOUNTER — Emergency Department (HOSPITAL_COMMUNITY)
Admission: EM | Admit: 2022-02-23 | Discharge: 2022-02-24 | Disposition: A | Discharge status: Home / Self Care | Payer: BC Managed Care – PPO | Attending: Emergency Medicine | Admitting: Emergency Medicine

## 2022-02-23 DIAGNOSIS — R45851 Suicidal ideations: Secondary | ICD-10-CM | POA: Insufficient documentation

## 2022-02-23 DIAGNOSIS — F4310 Post-traumatic stress disorder, unspecified: Secondary | ICD-10-CM | POA: Diagnosis not present

## 2022-02-23 DIAGNOSIS — Z046 Encounter for general psychiatric examination, requested by authority: Secondary | ICD-10-CM | POA: Diagnosis present

## 2022-02-23 DIAGNOSIS — Z20822 Contact with and (suspected) exposure to covid-19: Secondary | ICD-10-CM | POA: Insufficient documentation

## 2022-02-23 LAB — CBC
HCT: 46.4 % (ref 39.0–52.0)
Hemoglobin: 15.8 g/dL (ref 13.0–17.0)
MCH: 28.4 pg (ref 26.0–34.0)
MCHC: 34.1 g/dL (ref 30.0–36.0)
MCV: 83.3 fL (ref 80.0–100.0)
Platelets: 335 10*3/uL (ref 150–400)
RBC: 5.57 MIL/uL (ref 4.22–5.81)
RDW: 13.2 % (ref 11.5–15.5)
WBC: 9.2 10*3/uL (ref 4.0–10.5)
nRBC: 0 % (ref 0.0–0.2)

## 2022-02-23 LAB — COMPREHENSIVE METABOLIC PANEL
ALT: 20 U/L (ref 0–44)
AST: 22 U/L (ref 15–41)
Albumin: 4.7 g/dL (ref 3.5–5.0)
Alkaline Phosphatase: 68 U/L (ref 38–126)
Anion gap: 7 (ref 5–15)
BUN: 15 mg/dL (ref 6–20)
CO2: 20 mmol/L — ABNORMAL LOW (ref 22–32)
Calcium: 9.6 mg/dL (ref 8.9–10.3)
Chloride: 110 mmol/L (ref 98–111)
Creatinine, Ser: 0.86 mg/dL (ref 0.61–1.24)
GFR, Estimated: >60 mL/min (ref >60)
Glucose, Bld: 104 mg/dL — ABNORMAL HIGH (ref 70–99)
Potassium: 3.4 mmol/L — ABNORMAL LOW (ref 3.5–5.1)
Sodium: 137 mmol/L (ref 135–145)
Total Bilirubin: 0.6 mg/dL (ref 0.3–1.2)
Total Protein: 8.6 g/dL — ABNORMAL HIGH (ref 6.5–8.1)

## 2022-02-23 LAB — RAPID URINE DRUG SCREEN, HOSP PERFORMED
Amphetamines: NOT DETECTED
Barbiturates: NOT DETECTED
Benzodiazepines: NOT DETECTED
Cocaine: NOT DETECTED
Opiates: NOT DETECTED
Tetrahydrocannabinol: POSITIVE — AB

## 2022-02-23 LAB — RESP PANEL BY RT-PCR (FLU A&B, COVID) ARPGX2
Influenza A by PCR: NEGATIVE
Influenza B by PCR: NEGATIVE
SARS Coronavirus 2 by RT PCR: NEGATIVE

## 2022-02-23 LAB — SALICYLATE LEVEL: Salicylate Lvl: <7 mg/dL — ABNORMAL LOW (ref 7.0–30.0)

## 2022-02-23 LAB — ACETAMINOPHEN LEVEL: Acetaminophen (Tylenol), Serum: <10 ug/mL — ABNORMAL LOW (ref 10–30)

## 2022-02-23 LAB — ETHANOL: Alcohol, Ethyl (B): <10 mg/dL (ref <10)

## 2022-02-23 MED ORDER — MELATONIN 3 MG PO TABS
3.0000 mg | ORAL_TABLET | Freq: Every day | ORAL | Status: DC
  Administered 2022-02-24: 3 mg via ORAL
  Filled 2022-02-23: qty 1

## 2022-02-23 MED ORDER — ALUM & MAG HYDROXIDE-SIMETH 200-200-20 MG/5ML PO SUSP: 30.0000 mL | Freq: Four times a day (QID) | ORAL | Status: DC | PRN

## 2022-02-23 MED ORDER — ACETAMINOPHEN 325 MG PO TABS: 650.0000 mg | ORAL_TABLET | ORAL | Status: DC | PRN

## 2022-02-23 MED ORDER — ONDANSETRON HCL 4 MG PO TABS: 4.0000 mg | ORAL_TABLET | Freq: Three times a day (TID) | ORAL | Status: DC | PRN
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
## 2022-02-23 NOTE — ED Notes (Signed)
Pt changed into burgundy scrubs. Belongings placed in 1 belonging bag and placed in patient cabinets above 5-8 nurse station
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
## 2022-02-24 ENCOUNTER — Inpatient Hospital Stay (HOSPITAL_COMMUNITY)
Admission: AD | Admit: 2022-02-24 | Discharge: 2022-03-02 | DRG: 885 | Disposition: A | Discharge status: Home / Self Care | Payer: BC Managed Care – PPO | Attending: Psychiatry | Admitting: Psychiatry

## 2022-02-24 DIAGNOSIS — F909 Attention-deficit hyperactivity disorder, unspecified type: Secondary | ICD-10-CM | POA: Diagnosis present

## 2022-02-24 DIAGNOSIS — A539 Syphilis, unspecified: Secondary | ICD-10-CM | POA: Diagnosis present

## 2022-02-24 DIAGNOSIS — G4700 Insomnia, unspecified: Secondary | ICD-10-CM | POA: Diagnosis present

## 2022-02-24 DIAGNOSIS — F332 Major depressive disorder, recurrent severe without psychotic features: Secondary | ICD-10-CM | POA: Diagnosis present

## 2022-02-24 DIAGNOSIS — F329 Major depressive disorder, single episode, unspecified: Secondary | ICD-10-CM

## 2022-02-24 DIAGNOSIS — F32A Depression, unspecified: Secondary | ICD-10-CM | POA: Diagnosis present

## 2022-02-24 DIAGNOSIS — Z5900 Homelessness unspecified: Secondary | ICD-10-CM

## 2022-02-24 DIAGNOSIS — F3130 Bipolar disorder, current episode depressed, mild or moderate severity, unspecified: Secondary | ICD-10-CM

## 2022-02-24 DIAGNOSIS — T7421XA Adult sexual abuse, confirmed, initial encounter: Secondary | ICD-10-CM | POA: Diagnosis present

## 2022-02-24 DIAGNOSIS — A530 Latent syphilis, unspecified as early or late: Secondary | ICD-10-CM | POA: Diagnosis present

## 2022-02-24 DIAGNOSIS — Z818 Family history of other mental and behavioral disorders: Secondary | ICD-10-CM | POA: Diagnosis not present

## 2022-02-24 DIAGNOSIS — R45851 Suicidal ideations: Secondary | ICD-10-CM | POA: Diagnosis present

## 2022-02-24 DIAGNOSIS — F39 Unspecified mood [affective] disorder: Secondary | ICD-10-CM | POA: Diagnosis not present

## 2022-02-24 DIAGNOSIS — Z9151 Personal history of suicidal behavior: Secondary | ICD-10-CM | POA: Diagnosis not present

## 2022-02-24 DIAGNOSIS — F4310 Post-traumatic stress disorder, unspecified: Secondary | ICD-10-CM | POA: Diagnosis not present

## 2022-02-24 DIAGNOSIS — F430 Acute stress reaction: Secondary | ICD-10-CM | POA: Diagnosis present

## 2022-02-24 DIAGNOSIS — F902 Attention-deficit hyperactivity disorder, combined type: Principal | ICD-10-CM

## 2022-02-24 LAB — RESP PANEL BY RT-PCR (FLU A&B, COVID) ARPGX2
Influenza A by PCR: NEGATIVE
Influenza B by PCR: NEGATIVE
SARS Coronavirus 2 by RT PCR: NEGATIVE

## 2022-02-24 MED ORDER — HYDROXYZINE HCL 25 MG PO TABS
25.0000 mg | ORAL_TABLET | Freq: Three times a day (TID) | ORAL | Status: DC | PRN
  Administered 2022-02-24 – 2022-03-01 (×6): 25 mg via ORAL
  Filled 2022-02-24 (×6): qty 1

## 2022-02-24 MED ORDER — ACETAMINOPHEN 325 MG PO TABS
650.0000 mg | ORAL_TABLET | Freq: Four times a day (QID) | ORAL | Status: DC | PRN
  Administered 2022-02-27 – 2022-02-28 (×2): 650 mg via ORAL
  Filled 2022-02-24 (×2): qty 2

## 2022-02-24 MED ORDER — LORAZEPAM 1 MG PO TABS
1.0000 mg | ORAL_TABLET | Freq: Once | ORAL | Status: AC
  Administered 2022-02-25: 1 mg via ORAL
  Filled 2022-02-24: qty 1

## 2022-02-24 MED ORDER — ALUM & MAG HYDROXIDE-SIMETH 200-200-20 MG/5ML PO SUSP
30.0000 mL | ORAL | Status: DC | PRN
  Administered 2022-02-28 – 2022-03-01 (×2): 30 mL via ORAL
  Filled 2022-02-24 (×2): qty 30

## 2022-02-24 MED ORDER — MAGNESIUM HYDROXIDE 400 MG/5ML PO SUSP: 30.0000 mL | Freq: Every day | ORAL | Status: DC | PRN

## 2022-02-24 NOTE — [facility] Group Notes (Signed)
PT did not attend NA group. 

## 2022-02-24 NOTE — ED Notes (Addendum)
Pt requested to be discharged. Explained to him that psychiatry recommended inpatient treatment and he would have to stay in hospital for that to happen. Pt expressed feeling anxious and requested his belongings so he could leave. Dr. Tomi Bamberger notified, reached out to psych to get outpatient resources. Pt did not want to wait for psych recommendations and left department.

## 2022-02-24 NOTE — ED Provider Notes (Addendum)
Emergency Medicine Observation Re-evaluation Note  Stanley Castro is a 21 y.o. male, seen on rounds today.  Pt initially presented to the ED for complaints of Suicidal Currently, the patient is waiting in the ED for disposition.  Physical Exam  BP 123/74 (BP Location: Right Arm)   Pulse (!) 58   Temp 97.9 F (36.6 C) (Oral)   Resp 18   SpO2 95%  Physical Exam General: No acute distress Cardiac: Regular rate Lungs: Breathing easily Psych: Depressed mood  ED Course / MDM  EKG:EKG Interpretation  Date/Time:  Tuesday Feb 23 2022 21:07:12 EDT Ventricular Rate:  102 PR Interval:  122 QRS Duration: 96 QT Interval:  326 QTC Calculation: 425 R Axis:   80 Text Interpretation: Sinus tachycardia with irregular rate Right atrial enlargement Consider right ventricular hypertrophy When compared with ECG of 09/20/2021, No significant change was found Confirmed by Dione Booze ([id number]) on 02/24/2022 5:48:43 AM  I have reviewed the labs performed to date as well as medications administered while in observation.  Recent changes in the last 24 hours include no acute changes.  Plan  Current plan is for inpatient treatment.  Randon Goldsmith is not under involuntary commitment.     Linwood Dibbles, MD 02/24/22 [phone]  Pt told the RN Cortes he did not want to stay any longer.  Pt not under IVC.   Attempted to inform psychiatry to see if an outpatient alternative treatment was possible.  Pt ended up leaving on his own before that could be done.   Linwood Dibbles, MD 02/24/22 [phone]
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
## 2022-02-24 NOTE — Progress Notes (Signed)
CSW requested [facility] Fransico Michael, RN to review for [facility]. CSW will assist follow.   Maryjean Ka, MSW, LCSWA 02/24/2022 1:01 AM

## 2022-02-24 NOTE — ED Notes (Addendum)
Pt resting.

## 2022-02-25 ENCOUNTER — Encounter (HOSPITAL_COMMUNITY): Payer: Self-pay | Admitting: Psychiatry

## 2022-02-25 DIAGNOSIS — F332 Major depressive disorder, recurrent severe without psychotic features: Secondary | ICD-10-CM

## 2022-02-25 DIAGNOSIS — A530 Latent syphilis, unspecified as early or late: Secondary | ICD-10-CM | POA: Diagnosis present

## 2022-02-25 LAB — LIPID PANEL
Cholesterol: 157 mg/dL (ref 0–200)
HDL: 35 mg/dL — ABNORMAL LOW (ref >40)
LDL Cholesterol: 102 mg/dL — ABNORMAL HIGH (ref 0–99)
Total CHOL/HDL Ratio: 4.5 RATIO
Triglycerides: 98 mg/dL (ref <150)
VLDL: 20 mg/dL (ref 0–40)

## 2022-02-25 LAB — COMPREHENSIVE METABOLIC PANEL
ALT: 19 U/L (ref 0–44)
AST: 18 U/L (ref 15–41)
Albumin: 4.2 g/dL (ref 3.5–5.0)
Alkaline Phosphatase: 64 U/L (ref 38–126)
Anion gap: 6 (ref 5–15)
BUN: 17 mg/dL (ref 6–20)
CO2: 25 mmol/L (ref 22–32)
Calcium: 10 mg/dL (ref 8.9–10.3)
Chloride: 108 mmol/L (ref 98–111)
Creatinine, Ser: 0.91 mg/dL (ref 0.61–1.24)
GFR, Estimated: >60 mL/min (ref >60)
Glucose, Bld: 90 mg/dL (ref 70–99)
Potassium: 4 mmol/L (ref 3.5–5.1)
Sodium: 139 mmol/L (ref 135–145)
Total Bilirubin: 0.5 mg/dL (ref 0.3–1.2)
Total Protein: 8 g/dL (ref 6.5–8.1)

## 2022-02-25 LAB — CBC
HCT: 45 % (ref 39.0–52.0)
Hemoglobin: 15.2 g/dL (ref 13.0–17.0)
MCH: 28.4 pg (ref 26.0–34.0)
MCHC: 33.8 g/dL (ref 30.0–36.0)
MCV: 84 fL (ref 80.0–100.0)
Platelets: 333 10*3/uL (ref 150–400)
RBC: 5.36 MIL/uL (ref 4.22–5.81)
RDW: 13.3 % (ref 11.5–15.5)
WBC: 6.5 10*3/uL (ref 4.0–10.5)
nRBC: 0 % (ref 0.0–0.2)

## 2022-02-25 LAB — HEMOGLOBIN A1C
Hgb A1c MFr Bld: 4.7 % — ABNORMAL LOW (ref 4.8–5.6)
Mean Plasma Glucose: 88.19 mg/dL

## 2022-02-25 LAB — TSH: TSH: 2.155 u[IU]/mL (ref 0.350–4.500)

## 2022-02-25 LAB — HIV ANTIBODY (ROUTINE TESTING W REFLEX): HIV Screen 4th Generation wRfx: NONREACTIVE

## 2022-02-25 LAB — GC/CHLAMYDIA PROBE AMP (~~LOC~~) NOT AT ARMC
Chlamydia: NEGATIVE
Comment: NEGATIVE
Comment: NORMAL
Neisseria Gonorrhea: NEGATIVE

## 2022-02-25 LAB — RPR
RPR Ser Ql: REACTIVE — AB
RPR Titer: 1:2 {titer}

## 2022-02-25 LAB — HEPATITIS PANEL, ACUTE
HCV Ab: NONREACTIVE
Hep A IgM: NONREACTIVE
Hep B C IgM: NONREACTIVE
Hepatitis B Surface Ag: NONREACTIVE

## 2022-02-25 MED ORDER — ESCITALOPRAM OXALATE 5 MG PO TABS
5.0000 mg | ORAL_TABLET | Freq: Every day | ORAL | Status: DC
  Administered 2022-02-26 – 2022-03-02 (×5): 5 mg via ORAL
  Filled 2022-02-25 (×7): qty 1

## 2022-02-25 MED ORDER — ARIPIPRAZOLE 5 MG PO TABS
5.0000 mg | ORAL_TABLET | Freq: Every day | ORAL | Status: DC
  Administered 2022-02-26 – 2022-03-02 (×5): 5 mg via ORAL
  Filled 2022-02-25 (×7): qty 1

## 2022-02-25 MED ORDER — TRAZODONE HCL 50 MG PO TABS
50.0000 mg | ORAL_TABLET | Freq: Every evening | ORAL | Status: DC | PRN
  Administered 2022-02-25 – 2022-02-26 (×2): 50 mg via ORAL
  Filled 2022-02-25: qty 1

## 2022-02-25 MED ORDER — NICOTINE 14 MG/24HR TD PT24
14.0000 mg | MEDICATED_PATCH | Freq: Every day | TRANSDERMAL | Status: DC
  Administered 2022-02-25 – 2022-03-02 (×6): 14 mg via TRANSDERMAL
  Filled 2022-02-25 (×9): qty 1

## 2022-02-25 MED ORDER — ARIPIPRAZOLE 2 MG PO TABS
2.0000 mg | ORAL_TABLET | Freq: Once | ORAL | Status: AC
  Administered 2022-02-25: 2 mg via ORAL
  Filled 2022-02-25 (×2): qty 1

## 2022-02-25 NOTE — Progress Notes (Signed)
     02/24/22 2120  Psych Admission Type (Psych Patients Only)  Admission Status Voluntary  Psychosocial Assessment  Patient Complaints Agitation;Anxiety;Decreased concentration;Crying spells;Hopelessness;Irritability;Loneliness;Panic attack;Sadness;Self-harm behaviors;Worrying;Shakiness  Eye Contact Fair  Facial Expression Anxious  Affect Depressed;Sad  Speech Logical/coherent;Soft  Interaction Needy;Assertive  Motor Activity Other (Comment) (WNL)  Appearance/Hygiene Disheveled  Behavior Characteristics Agitated  Mood Depressed;Anxious;Sad  Thought Process  Coherency Circumstantial  Content Blaming others  Delusions None reported or observed  Perception WDL  Hallucination None reported or observed  Judgment Impaired  Confusion None  Danger to Self  Current suicidal ideation? Passive  Description of Suicide Plan no plan  Agreement Not to Harm Self Yes  Description of Agreement verbal contract for safety  Danger to Others  Danger to Others None reported or observed
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
## 2022-02-25 NOTE — Group Note (Unsigned)
Date:  02/25/2022 Time:  10:33 AM  Group Topic/Focus:  Orientation:   The focus of this group is to educate the patient on the purpose and policies of crisis stabilization and provide a format to answer questions about their admission.  The group details unit policies and expectations of patients while admitted.     Participation Level:  {BHH PARTICIPATION LEVEL:22264}  Participation Quality:  {BHH PARTICIPATION QUALITY:22265}  Affect:  {BHH AFFECT:22266}  Cognitive:  {BHH COGNITIVE:22267}  Insight: {BHH Insight2:20797}  Engagement in Group:  {BHH ENGAGEMENT IN GROUP:22268}  Modes of Intervention:  {BHH MODES OF INTERVENTION:22269}  Additional Comments:  ***  Manning Luna Lashawn Cindy Brindisi 02/25/2022, 10:33 AM  
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
## 2022-02-25 NOTE — Tx Team (Signed)
Initial Treatment Plan 02/25/2022 12:27 AM Stanley Castro [id number]    PATIENT STRESSORS: Financial difficulties   Legal issue   Loss of relationship with boyfriend   Marital or family conflict   Occupational concerns   Other: Housing     PATIENT STRENGTHS: Ability for insight  General fund of knowledge    PATIENT IDENTIFIED PROBLEMS: Suicide  Depression  Anxiety      "Help with controlling my Suicidal thoughts"  " Help with my depression"         DISCHARGE CRITERIA:  Ability to meet basic life and health needs Adequate post-discharge living arrangements Improved stabilization in mood, thinking, and/or behavior  PRELIMINARY DISCHARGE PLAN: Outpatient therapy  PATIENT/FAMILY INVOLVEMENT: This treatment plan has been presented to and reviewed with the patient, Stanley Castro. [name].  The patient and family have been given the opportunity to ask questions and make suggestions.  Maryellen Pile, RN 02/25/2022, 12:27 AM

## 2022-02-25 NOTE — Plan of Care (Signed)
  Problem: Education: Goal: Emotional status will improve Outcome: Not Progressing Goal: Mental status will improve Outcome: Not Progressing Goal: Verbalization of understanding the information provided will improve Outcome: Not Progressing   
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
## 2022-02-25 NOTE — Group Note (Signed)
Date:  02/25/2022 Time:  10:29 AM  Group Topic/Focus:  Orientation:   The focus of this group is to educate the patient on the purpose and policies of crisis stabilization and provide a format to answer questions about their admission.  The group details unit policies and expectations of patients while admitted.    Participation Level:  Did Not Attend  Participation Quality:    Affect:    Cognitive:    Insight:   Engagement in Group:    Modes of Intervention:    Additional Comments:    Garvin Fila 02/25/2022, 10:29 AM

## 2022-02-25 NOTE — Progress Notes (Signed)
DAR NOTE: Patient presents with a flat affect and depressed mood.  Denies suicidal thoughts, auditory and visual hallucinations.  Patient remained in bed for majority of this shift.  Maintained on routine safety checks.  Medications given as prescribed.  Support and encouragement offered as needed.  Attended group and participated.  Patient is safe on the unit.     Offered no complaint.

## 2022-02-25 NOTE — Progress Notes (Signed)
     02/25/22 2120  Psych Admission Type (Psych Patients Only)  Admission Status Voluntary  Psychosocial Assessment  Patient Complaints Anxiety;Depression  Eye Contact Fair  Facial Expression Flat  Affect Depressed  Speech Logical/coherent  Interaction Assertive  Motor Activity Other (Comment) (WNL)  Appearance/Hygiene Improved  Behavior Characteristics Cooperative  Mood Depressed  Thought Process  Coherency Circumstantial  Content Blaming others  Delusions None reported or observed  Perception WDL  Hallucination None reported or observed  Judgment Impaired  Confusion None  Danger to Self  Current suicidal ideation? Denies  Agreement Not to Harm Self Yes  Description of Agreement verbal contract for safety  Danger to Others  Danger to Others None reported or observed

## 2022-02-25 NOTE — Progress Notes (Signed)
BHH Group Notes:  (Nursing/MHT/Case Management/Adjunct)  Date:  02/25/2022  Time:  2015  Type of Therapy:   wrap up group  Participation Level:  Active  Participation Quality:  Appropriate, Attentive, Sharing, and Supportive  Affect:  Appropriate  Cognitive:  Alert  Insight:  Improving  Engagement in Group:  Engaged  Modes of Intervention:  Clarification, Education, and Support  Summary of Progress/Problems: Positive thinking and positive change were discussed.   Marcille Buffy 02/25/2022, 9:30 PM

## 2022-02-26 ENCOUNTER — Encounter (HOSPITAL_COMMUNITY): Payer: Self-pay

## 2022-02-26 LAB — T.PALLIDUM AB, TOTAL: T Pallidum Abs: REACTIVE — AB

## 2022-02-26 MED ORDER — TRAZODONE HCL 50 MG PO TABS
50.0000 mg | ORAL_TABLET | Freq: Once | ORAL | Status: AC
  Filled 2022-02-26 (×2): qty 1

## 2022-02-26 MED ORDER — PENICILLIN G BENZATHINE 1200000 UNIT/2ML IM SUSY
2.4000 10*6.[IU] | PREFILLED_SYRINGE | INTRAMUSCULAR | Status: DC
  Administered 2022-02-26: 2.4 10*6.[IU] via INTRAMUSCULAR
  Filled 2022-02-26: qty 4

## 2022-02-26 MED ORDER — MELATONIN 3 MG PO TABS
3.0000 mg | ORAL_TABLET | Freq: Every day | ORAL | Status: DC
  Administered 2022-02-26 – 2022-03-01 (×4): 3 mg via ORAL
  Filled 2022-02-26 (×7): qty 1
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
## 2022-02-26 NOTE — [facility] Counselor (Signed)
CSW provided the Pt with a packet that contains information including shelter and housing resources, free and reduced price food information, clothing resources, crisis center information, a GoodRX card, and suicide prevention information.   

## 2022-02-26 NOTE — Progress Notes (Signed)
Adult Psychoeducational Group Note  Date:  02/26/2022 Time:  8:39 PM  Group Topic/Focus:  Wrap-Up Group:   The focus of this group is to help patients review their daily goal of treatment and discuss progress on daily workbooks.  Participation Level:  Active  Participation Quality:  Appropriate  Affect:  Appropriate  Cognitive:  Appropriate  Insight: Appropriate  Engagement in Group:  Engaged  Modes of Intervention:  Discussion  Additional Comments:    Chauncey Fischer 02/26/2022, 8:39 PM

## 2022-02-26 NOTE — Progress Notes (Signed)
   02/26/22 2159  Psych Admission Type (Psych Patients Only)  Admission Status Voluntary  Psychosocial Assessment  Patient Complaints None  Eye Contact Fair  Facial Expression Other (Comment) (smiles upon approach)  Affect Appropriate to circumstance  Speech Logical/coherent  Interaction Assertive  Motor Activity Other (Comment) (wnl)  Appearance/Hygiene Unremarkable  Behavior Characteristics Cooperative;Appropriate to situation  Thought Process  Coherency WDL  Content WDL  Delusions None reported or observed  Perception WDL  Hallucination None reported or observed  Judgment WDL  Confusion None  Danger to Self  Current suicidal ideation? Denies
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
## 2022-02-26 NOTE — Progress Notes (Signed)
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, [place], [place] [address] Phone: [phone]   -discussed with primary team

## 2022-02-26 NOTE — Group Note (Signed)
Recreation Therapy Group Note   Group Topic:Stress Management  Group Date: 02/26/2022 Start Time: 0930 End Time: 0945 Facilitators: Caroll Rancher, [place] Location: [address] Dayroom   Goal Area(s) Addresses:  Patient will identify positive stress management techniques. Patient will identify benefits of using stress management post d/c.  Group Description:  Meditation.  LRT explained the stress management technique of meditation to patients.  LRT then played a meditation that focused on bringing positive energy into your day through positive self talk.  Patients were to listen as meditation played and quietly repeat the affirmations to themselves to get the full affect of the exercise.  At conclusion, LRT made suggestions of where patients could find more meditations and other stress management techniques.   Affect/Mood: N/A   Participation Level: Did not attend    Clinical Observations/Individualized Feedback:     Plan: Continue to engage patient in RT group sessions 2-3x/week.   Caroll Rancher, Antonietta Jewel 02/26/2022 12:39 PM

## 2022-02-26 NOTE — Progress Notes (Signed)
Pt denied SI/HI/AVH.  Pt guarded on approach and said, "I'm fine - I am ready to go home."  Pt tolerated IM injection without incident.  No adverse reactions to medications were noted.  Pt is soft spoken and makes his needs known.  Vital signs are stable.

## 2022-02-26 NOTE — Group Note (Signed)
LCSW Group Therapy Note   Group Date: 02/26/2022 Start Time: 1300 End Time: 1400  Type of Therapy and Topic:  Group Therapy - Healthy vs Unhealthy Coping Skills  Participation Level:  Did Not Attend   Description of Group The focus of this group was to determine what unhealthy coping techniques typically are used by group members and what healthy coping techniques would be helpful in coping with various problems. Patients were guided in becoming aware of the differences between healthy and unhealthy coping techniques. Patients were asked to identify 2-3 healthy coping skills they would like to learn to use more effectively.  Therapeutic Goals Patients learned that coping is what human beings do all day long to deal with various situations in their lives Patients defined and discussed healthy vs unhealthy coping techniques Patients identified their preferred coping techniques and identified whether these were healthy or unhealthy Patients determined 2-3 healthy coping skills they would like to become more familiar with and use more often. Patients provided support and ideas to each other   Summary of Patient Progress:  Did not attend    Therapeutic Modalities Cognitive Behavioral Therapy Motivational Interviewing  Darleen Crocker, [place] 02/26/2022  2:08 PM

## 2022-02-26 NOTE — Group Note (Signed)
Date:  02/26/2022 Time:  9:57 AM  Group Topic/Focus:  Orientation:   The focus of this group is to educate the patient on the purpose and policies of crisis stabilization and provide a format to answer questions about their admission.  The group details unit policies and expectations of patients while admitted.    Participation Level:  Did Not Attend  Participation Quality:    Affect:    Cognitive:    Insight:   Engagement in Group:    Modes of Intervention:    Additional Comments:    Garvin Fila 02/26/2022, 9:57 AM
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
## 2022-02-26 NOTE — Plan of Care (Signed)
  Problem: Activity: Goal: Interest or engagement in activities will improve Outcome: Progressing   Problem: Education: Goal: Emotional status will improve Outcome: Not Progressing Goal: Mental status will improve Outcome: Not Progressing   

## 2022-02-27 NOTE — Progress Notes (Signed)
   02/27/22 0900  Psych Admission Type (Psych Patients Only)  Admission Status Voluntary  Psychosocial Assessment  Patient Complaints None  Eye Contact Fair  Facial Expression Other (Comment) (WDL)  Affect Appropriate to circumstance  Speech Logical/coherent  Interaction Assertive  Motor Activity Other (Comment) (WDL)  Appearance/Hygiene Unremarkable  Behavior Characteristics Cooperative;Appropriate to situation  Thought Process  Coherency WDL  Content WDL  Delusions None reported or observed  Perception WDL  Hallucination None reported or observed  Judgment WDL  Confusion None  Danger to Self  Current suicidal ideation? Denies  Self-Injurious Behavior No self-injurious ideation or behavior indicators observed or expressed   Agreement Not to Harm Self Yes  Description of Agreement Verbal  Danger to Others  Danger to Others None reported or observed

## 2022-02-27 NOTE — Group Note (Signed)
LCSW Group Therapy Note  Group Date: 02/27/2022 Start Time: 1000 End Time: 1100   Type of Therapy and Topic:  Group Therapy: Anger Cues and Responses  Participation Level:  Active   Description of Group:   In this group, patients learned how to recognize the physical, cognitive, emotional, and behavioral responses they have to anger-provoking situations.  They identified a recent time they became angry and how they reacted.  They analyzed how their reaction was possibly beneficial and how it was possibly unhelpful.  The group discussed a variety of healthier coping skills that could help with such a situation in the future.  Focus was placed on how helpful it is to recognize the underlying emotions to our anger, because working on those can lead to a more permanent solution as well as our ability to focus on the important rather than the urgent.  Therapeutic Goals: Patients will remember their last incident of anger and how they felt emotionally and physically, what their thoughts were at the time, and how they behaved. Patients will identify how their behavior at that time worked for them, as well as how it worked against them. Patients will explore possible new behaviors to use in future anger situations. Patients will learn that anger itself is normal and cannot be eliminated, and that healthier reactions can assist with resolving conflict rather than worsening situations.  Summary of Patient Progress:  Stanley Castro was active during the group. He shared a recent occurrence wherein feeling judged and betrayed led to anger. He demonstrated good and growing insight into the subject matter, was respectful of peers, and participated throughout the entire session.  Therapeutic Modalities:   Cognitive Behavioral Therapy    Lynnell Chad, LCSWA 02/27/2022  1:02 PM

## 2022-02-27 NOTE — BHH Group Notes (Signed)
Psychoeducational Group Note  Date: 02/27/2022 Time: 0900-1000    Goal Setting   Purpose of Group: This group helps to provide patients with the steps of setting a goal that is specific, measurable, attainable, realistic and time specific. A discussion on how we keep ourselves stuck with negative self talk. Homework given for Patients to write 30 positive attributes about themselves.    Participation Level:  Active  Participation Quality:  Appropriate  Affect:  Appropriate  Cognitive:  Appropriate  Insight:  Improving  Engagement in Group:  Engaged  Additional Comments:  Pt rates his energy at a 6/10. Participated fully in the group.  Dione Housekeeper

## 2022-02-27 NOTE — BHH Group Notes (Signed)
.  Psychoeducational Group Note    Date:  5/27//23 Time: 1300-1400    Purpose of Group: . The group focus' on teaching patients on how to identify their needs and their Life Skills:  A group where two lists are made. What people need and what are things that we do that are unhealthy. The lists are developed by the patients and it is explained that we often do the actions that are not healthy to get our list of needs met.  Goal:: to develop the coping skills needed to get their needs met  Participation Level:  Active  Participation Quality:  Appropriate  Affect:  Appropriate  Cognitive:  Oriented  Insight:  Improving  Engagement in Group:  Engaged  Additional Comments: Rates energy at an 8/10. Participated fully in the group.  Paulino Rily
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
## 2022-02-27 NOTE — Progress Notes (Signed)
Adult Psychoeducational Group Note  Date:  02/27/2022 Time:  8:25 PM  Group Topic/Focus:  Wrap-Up Group:   The focus of this group is to help patients review their daily goal of treatment and discuss progress on daily workbooks.  Participation Level:  Active  Participation Quality:  Appropriate and Attentive  Affect:  Appropriate  Cognitive:  Alert and Appropriate  Insight: Appropriate  Engagement in Group:  Engaged  Modes of Intervention:  Discussion  Additional Comments:   Pt was engaged during group discussion. Pt states that he had a "great" day and rated it at a 10/10. Pt states that he's been out to the gym today and has enjoyed socializing. Pt has communicated with his care team and has talk to friends and family. Pt  denies everything.  Vevelyn Pat 02/27/2022, 8:25 PM

## 2022-02-28 DIAGNOSIS — F39 Unspecified mood [affective] disorder: Secondary | ICD-10-CM

## 2022-02-28 NOTE — Group Note (Signed)
[facility] LCSW Group Therapy Note  02/28/2022   10:00-11:00AM  Type of Therapy and Topic:  Group Therapy:  Unhealthy versus Healthy Supports, Which Am I?  Participation Level:  Active   Description of Group:  Patients in this group were introduced to the concept that additional supports including self-support are an essential part of recovery.  Initially a discussion was held about the differences between healthy versus unhealthy supports.  Patients were asked to share what unhealthy supports in their lives need to be addressed, as well as what additional healthy supports could be added for greater help in reaching their goals.   A song entitled "My Own Hero" was played and a group discussion ensued in which patients stated they could relate to the song and it inspired them to realize they have be willing to help themselves in order to succeed, because other people cannot achieve sobriety or stability for them.  We discussed adding a variety of healthy supports to address the various needs in patient lives, including becoming more self-supportive.  A song was played called "I Am Enough" toward the end of group and used to conduct an inspirational wrap-up to group of remembering that they are indeed enough in this life.  Therapeutic Goals: 1)  Highlight the differences between healthy and unhealthy supports 2)  Suggest the importance of being a part of one's own support system 2)  Discuss reasons people in one's life may eventually be unable to be continually supportive  3)  Identify the patient's current support system   4) Elicit commitments to add healthy supports and to become more conscious of being self-supportive   Summary of Patient Progress:  The patient listed as healthy supports the following:  brother.  The patient expressed that unhealthy supports to be addressed include stepfather.  Patient feels they are an unhealthy support for themselves.  Healthy supports which could be added for  increased stability and happiness include therapy.  Therapeutic Modalities:   Motivational Interviewing Activity  Lynnell Chad , MSW, LCSW

## 2022-02-28 NOTE — Progress Notes (Signed)
Adult Psychoeducational Group Note  Date:  02/28/2022 Time:  10:43 PM  Group Topic/Focus:  Wrap-Up Group:   The focus of this group is to help patients review their daily goal of treatment and discuss progress on daily workbooks.  Participation Level:  Active  Participation Quality:  Appropriate  Affect:  Appropriate  Cognitive:  Appropriate  Insight: Appropriate  Engagement in Group:  Developing/Improving  Modes of Intervention:  Discussion  Additional Comments:  Pt stated his goal for today was to focus on his treatment plan and to talked with his doctor about his discharge plan. Pt stated he accomplished his goals today. Pt stated he talked with his doctor and social worker about his care today. Pt rated his overall day a 10. Pt stated he made no calls today. Pt stated he felt better about himself today. Pt stated he was able to attend all meals. Pt stated he took all medications provided today. Pt stated he attend all groups held today. Pt stated his appetite was pretty good today. Pt rated sleep last night was pretty good. Pt stated the goal tonight was to get some rest. Pt stated he had no physical pain tonight. Pt deny visual hallucinations and auditory issues tonight. Pt denies thoughts of harming himself or others. Pt stated he would alert staff if anything changed  Felipa Furnace 02/28/2022, 10:43 PM

## 2022-02-28 NOTE — Progress Notes (Signed)
   02/27/22 2130  Psych Admission Type (Psych Patients Only)  Admission Status Voluntary  Psychosocial Assessment  Patient Complaints Anxiety  Eye Contact Fair  Facial Expression Other (Comment) (smiles upon approach)  Affect Appropriate to circumstance  Speech Logical/coherent  Interaction Assertive  Motor Activity Other (Comment) (wnl)  Appearance/Hygiene Unremarkable  Behavior Characteristics Cooperative;Appropriate to situation;Anxious  Mood Pleasant  Thought Process  Coherency WDL  Content WDL  Delusions None reported or observed  Perception WDL  Hallucination None reported or observed  Judgment WDL  Confusion None  Danger to Self  Current suicidal ideation? Denies

## 2022-02-28 NOTE — [facility] Group Notes (Signed)
Adult Psychoeducational Group Not Date:  02/28/2022 Time:  0900-1045 Group Topic/Focus: PROGRESSIVE RELAXATION. A group where deep breathing is taught and tensing and relaxation muscle groups is used. Imagery is used as well.  Pts are asked to imagine 3 pillars that hold them up when they are not able to hold themselves up and to share that with the group.  Participation Level:  did not attend Martesha Niedermeier A   

## 2022-02-28 NOTE — [facility] Group Notes (Signed)
.  Psychoeducational Group Note    Date:  5/28//23 Time: 1300-1400    Purpose of Group: . The group focus' on teaching patients on how to identify their needs and their Life Skills:  Castro group where two lists are made. What people need and what are things that we do that are unhealthy. The lists are developed by the patients and it is explained that we often do the actions that are not healthy to get our list of needs met.  Goal:: to develop the coping skills needed to get their needs met  Participation Level:  did not attend  Stanley Castro  
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
## 2022-02-28 NOTE — Progress Notes (Signed)
   02/28/22 0900  Psych Admission Type (Psych Patients Only)  Admission Status Voluntary  Psychosocial Assessment  Patient Complaints Anxiety  Eye Contact Fair  Facial Expression Animated  Affect Appropriate to circumstance  Speech Logical/coherent  Interaction Assertive  Motor Activity Other (Comment) (WDL)  Appearance/Hygiene Disheveled  Behavior Characteristics Cooperative;Appropriate to situation  Mood Pleasant  Thought Process  Coherency WDL  Content WDL  Delusions None reported or observed  Perception WDL  Hallucination None reported or observed  Judgment WDL  Confusion None  Danger to Self  Current suicidal ideation? Denies  Self-Injurious Behavior No self-injurious ideation or behavior indicators observed or expressed   Description of Agreement Verbal  Danger to Others  Danger to Others None reported or observed

## 2022-02-28 NOTE — Progress Notes (Signed)
   02/28/22 2300  Psych Admission Type (Psych Patients Only)  Admission Status Voluntary  Psychosocial Assessment  Patient Complaints Anxiety  Eye Contact Fair  Facial Expression Other (Comment) (smiles upon approach)  Affect Appropriate to circumstance  Speech Logical/coherent  Interaction Assertive  Motor Activity Other (Comment) (wnl)  Appearance/Hygiene Unremarkable  Behavior Characteristics Cooperative;Appropriate to situation  Mood Pleasant  Thought Process  Coherency WDL  Content WDL  Delusions None reported or observed  Perception WDL  Hallucination None reported or observed  Judgment WDL  Confusion None  Danger to Self  Current suicidal ideation? Denies

## 2022-03-01 DIAGNOSIS — F332 Major depressive disorder, recurrent severe without psychotic features: Principal | ICD-10-CM

## 2022-03-01 NOTE — [facility] Group Notes (Signed)
  Spiritual care group on grief and loss facilitated by chaplain Dyanne Carrel, [facility]   Group Goal:   Support / Education around grief and loss   Members engage in facilitated group support and psycho-social education.   Group Description:   Following introductions and group rules, group members engaged in facilitated group dialog and support around topic of loss, with particular support around experiences of loss in their lives. Group Identified types of loss (relationships / self / things) and identified patterns, circumstances, and changes that precipitate losses. Reflected on thoughts / feelings around loss, normalized grief responses, and recognized variety in grief experience. Group noted Worden's four tasks of grief in discussion.   Group drew on Adlerian / Rogerian, narrative, MI,   Patient Progress: Stanley Castro attended group and participated and engaged in conversation.  He shared about the loss of his mother when he was 27 and how he is grieving now.  He has been told many times to "get over it" and was able to take in that grief has no timeline.  [address] [facility] Pager, [phone]
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
## 2022-03-01 NOTE — Progress Notes (Signed)
The patient attended the evening A.A.meeting and was appropriate.  

## 2022-03-01 NOTE — Progress Notes (Signed)
D:  Patient's self inventory sheet, patient sleeps good, sleep medication helpful.  Good appetite, high energy level, good concentration.  Denied depression, hopeless and anxiety.  Denied withdrawals.  Denied SI.  Denied physical problems.  Physical pain, butt, worst pain #3, pain medicine helpful.  "Getting better" is goal.  Plans to get his shots.  Does have discharge plans.   A:  Medications administered per MD orders.  Emotional support and encouragement given patient. R:  Denied SI and HI, contracts for safety.  Denied A/V hallucinations.  Safety maintained with 15 minute checks.  Patient stated he slept 8 hours last night.  Patient stated he to complete his antibiotic medications.

## 2022-03-01 NOTE — Plan of Care (Signed)
Nurse discussed anxiety, depression and coping skills with patient.  

## 2022-03-01 NOTE — Group Note (Signed)
[facility] LCSW Group Therapy Note   Group Date: 03/01/2022 Start Time: 1300 End Time: 1330   Type of Therapy/Topic:  Group Therapy:  Emotion Regulation  Participation Level:  Active   Mood: Euthymic   Description of Group:    The purpose of this group is to assist patients in learning to regulate negative emotions and experience positive emotions. Patients will be guided to discuss ways in which they have been vulnerable to their negative emotions. These vulnerabilities will be juxtaposed with experiences of positive emotions or situations, and patients challenged to use positive emotions to combat negative ones. Special emphasis will be placed on coping with negative emotions in conflict situations, and patients will process healthy conflict resolution skills.  Therapeutic Goals: Patient will identify two positive emotions or experiences to reflect on in order to balance out negative emotions:  Patient will label two or more emotions that they find the most difficult to experience:  Patient will be able to demonstrate positive conflict resolution skills through discussion or role plays:   Summary of Patient Progress:   Patient was present for the entirety of the group session. Patient was an active listener and participated in the topic of discussion, provided helpful advice to others, and added nuance to topic of conversation. Patient participated in example exercises in identifying potential emotions. Patient often provided satirical/ironic responses to exercises such as stating a common emotional response to child birth is "anger." Patient was provided opportunity to explain his answer to which he was able to provide a logical explanation.     Therapeutic Modalities:   Cognitive Behavioral Therapy Feelings Identification Dialectical Behavioral Therapy   Corky Crafts, [place]

## 2022-03-01 NOTE — Progress Notes (Signed)
[facility] Group Notes- Patients were educated on the difference between positive and negative thinking and how positive reframing can impact mood. The patients were then given a poem to read by the Dalai Lama on the power of thoughts. The patients were then asked to share one negative thought or belief they would like to let go of. Patient shared and participated.  

## 2022-03-02 LAB — GC/CHLAMYDIA PROBE AMP (~~LOC~~) NOT AT ARMC
Chlamydia: NEGATIVE
Comment: NEGATIVE
Comment: NORMAL
Neisseria Gonorrhea: NEGATIVE

## 2022-03-02 MED ORDER — NICOTINE 14 MG/24HR TD PT24
14.0000 mg | MEDICATED_PATCH | Freq: Every day | TRANSDERMAL | 0 refills | Status: AC
Start: 2022-03-03 — End: ?

## 2022-03-02 MED ORDER — HYDROXYZINE HCL 25 MG PO TABS: 25.0000 mg | ORAL_TABLET | Freq: Three times a day (TID) | ORAL | 0 refills | Status: AC | PRN

## 2022-03-02 MED ORDER — ESCITALOPRAM OXALATE 5 MG PO TABS: 5.0000 mg | ORAL_TABLET | Freq: Every day | ORAL | 0 refills | Status: AC

## 2022-03-02 MED ORDER — ARIPIPRAZOLE 5 MG PO TABS: 5.0000 mg | ORAL_TABLET | Freq: Every day | ORAL | 0 refills | Status: AC

## 2022-03-02 MED ORDER — MELATONIN 3 MG PO TABS: 3.0000 mg | ORAL_TABLET | Freq: Every day | ORAL | 0 refills | Status: AC

## 2022-03-02 NOTE — Group Note (Signed)
Recreation Therapy Group Note   Group Topic:Animal Assisted Therapy   Group Date: 03/02/2022 Start Time: 1430 End Time: 1515 Facilitators: Victorino Sparrow, LRT,CTRS Location: [place]   AAA/T Program Assumption of Risk Form signed by Patient/ or Parent Legal Guardian Yes  Patient is free of allergies or severe asthma Yes  Patient reports no fear of animals Yes  Patient reports no history of cruelty to animals Yes  Patient understands his/her participation is voluntary Yes  Patient washes hands before animal contact Yes  Patient washes hands after animal contact Yes   Affect/Mood: Appropriate   Participation Level: Engaged    Clinical Observations/Individualized Feedback:   Patient attended session and interacted appropriately with therapy dog and peers. Patient asked appropriate questions about therapy dog and his training. Patient shared stories about their pets at home with group.    Plan: Continue to engage patient in RT group sessions 2-3x/week.   Victorino Sparrow, LRT,CTRS 03/02/2022 3:59 PM

## 2022-03-02 NOTE — [facility] Counselor (Addendum)
Update: CSW spoke with the Pt's friend, Silas Sacramento who confirms that his friend can return to the home.  He states that the firearm in the home is locked in a safe and that there are no other weapons in the home.  He states that he is not certain about the timeline of his friend moving to [place] but will be continuing to speak with the friend in [place] to assist in that move.  CSW will inform the providers of this information.   CSW attempted to contact the Pt's friend, Silas Sacramento to discuss the Pt living with him after discharge.  There was no answer.  CSW left a voicemail on 03/02/2022 at 11:15am asking for a return call.  CSW will continue to make attempts to contact the Pt's friend.

## 2022-03-02 NOTE — Progress Notes (Signed)
RN met with pt and reviewed pt's discharge instructions. Pt verbalized understanding of discharge instructions and pt did not have any questions. RN reviewed and provided pt with a copy of SRA, AVS and Transition Record. RN returned pt's belongings to pt. Pt denied SI/HI/AVH and voiced no concerns. Pt was appreciative of the care pt received at [facility]. Patient discharged to the lobby without incident.  

## 2022-03-02 NOTE — Progress Notes (Signed)
   03/02/22 0700  Sleep  Number of Hours 7.25    
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
## 2022-03-02 NOTE — Progress Notes (Signed)
  [facility] Adult Case Management Discharge Plan :  Will you be returning to the same living situation after discharge:  No. Friend/Brothers Home  At discharge, do you have transportation home?: Yes,  Friend  Do you have the ability to pay for your medications: Yes,  Medicaid   Release of information consent forms completed and in the chart;  Patient's signature needed at discharge.  Patient to Follow up at:  Follow-up Information     [facility]. Go on 03/04/2022.   Specialty: Behavioral Health Why: You have a walk in appointment for therapy and medication management on 03/04/22 at 7:30 am.  Services are provided on a first come, first served basis. Contact information: [address] [place] [address] [phone]        [facility]. Call.   Why: You may contact this agency to inquire about therapy and medication management services within that state. Contact information: Address: [address] [place], [place] [address]   Phone: [phone]        [facility]. Call.   Why: You may contact this agency to inquire about therapy, medication management, and primary care services within this state. Contact information: Address: [address] [place], [place] - [address] Phone: [phone]        [facility]. Go on 04/13/2022.   Specialty: Infectious Diseases Why: You have an appointment scheduled for 04/13/2022 at 10:45am with Dr. Renold Don.  Please inquire about your 2 Penicillin shots while at this appointment.  You may also inquire about hosuing services while at this appointment. Contact information: [address] [id number] mc [address] [place] [address] [phone]        [facility]. Call.   Why: You may contact this agency to schedule primary care services. Contact information: [address] [place]  [address] [phone]                Next level of care provider has access to [facility] Link:yes  Safety Planning and Suicide Prevention discussed: Yes,  with patient and friend/Brother      Has patient been referred to the Quitline?: Patient refused referral  Patient has been referred for addiction treatment: Pt. refused referral  Aram Beecham, LCSWA 03/02/2022, 1:21 PM

## 2022-03-02 NOTE — Progress Notes (Signed)
   03/02/22 0700  Sleep  Number of Hours 7.25
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
## 2022-03-02 NOTE — Progress Notes (Signed)
   03/02/22 0100  Psych Admission Type (Psych Patients Only)  Admission Status Voluntary  Psychosocial Assessment  Patient Complaints Anxiety  Eye Contact Fair  Facial Expression Other (Comment) (WDL)  Affect Appropriate to circumstance  Speech Logical/coherent  Interaction Assertive  Motor Activity Other (Comment) (WDL)  Appearance/Hygiene Unremarkable  Behavior Characteristics Cooperative  Mood Anxious  Thought Process  Coherency WDL  Content WDL  Delusions None reported or observed  Perception WDL  Hallucination None reported or observed  Judgment WDL  Confusion None  Danger to Self  Current suicidal ideation? Denies  Agreement Not to Harm Self Yes  Description of Agreement verbal contract  Danger to Others  Danger to Others None reported or observed

## 2022-03-03 ENCOUNTER — Other Ambulatory Visit: Payer: Self-pay | Admitting: Student in an Organized Health Care Education/Training Program

## 2022-03-03 DIAGNOSIS — A530 Latent syphilis, unspecified as early or late: Secondary | ICD-10-CM

## 2022-03-11 LAB — CERVICOVAGINAL ANCILLARY ONLY
Comment: NEGATIVE
Comment: NORMAL

## 2022-03-17 NOTE — Progress Notes (Signed)
Attempted to contact patient with test results.  There was no answer.  Will attempt again later.   Arna Snipe MD Resident

## 2022-03-18 NOTE — Progress Notes (Signed)
Was able to contact patient to discuss results of GC/Chlam swab test.  Discussed that results were inconclusive and recommended retesting.  Discussed that he can call/go to the Health Department or call the Infectious Disease Clinic to schedule this.  He reported understanding and no other concerns at present.   Arna Snipe MD Resident

## 2022-04-13 ENCOUNTER — Ambulatory Visit: Payer: BC Managed Care – PPO | Admitting: Internal Medicine

## 2022-07-21 ENCOUNTER — Institutional Professional Consult (permissible substitution): Payer: BC Managed Care – PPO | Admitting: Pulmonary Disease

## 2022-12-09 ENCOUNTER — Other Ambulatory Visit (HOSPITAL_COMMUNITY): Payer: Self-pay

## 2022-12-09 MED ORDER — LISDEXAMFETAMINE DIMESYLATE 40 MG PO CAPS
40.0000 mg | ORAL_CAPSULE | Freq: Every day | ORAL | 0 refills | Status: DC
  Filled 2022-12-09: qty 30, 30d supply, fill #0

## 2022-12-09 MED ORDER — ARIPIPRAZOLE 15 MG PO TABS
15.0000 mg | ORAL_TABLET | Freq: Every day | ORAL | 0 refills | Status: DC
  Filled 2022-12-09: qty 30, 30d supply, fill #0

## 2022-12-09 MED ORDER — DIVALPROEX SODIUM ER 500 MG PO TB24
500.0000 mg | ORAL_TABLET | Freq: Every day | ORAL | 0 refills | Status: DC
  Filled 2022-12-09: qty 30, 30d supply, fill #0

## 2023-01-06 ENCOUNTER — Other Ambulatory Visit (HOSPITAL_COMMUNITY): Payer: Self-pay

## 2023-01-06 MED ORDER — ARIPIPRAZOLE 15 MG PO TABS
15.0000 mg | ORAL_TABLET | Freq: Every day | ORAL | 0 refills | Status: DC
  Filled 2023-01-06: qty 30, 30d supply, fill #0

## 2023-01-06 MED ORDER — LISDEXAMFETAMINE DIMESYLATE 40 MG PO CAPS
40.0000 mg | ORAL_CAPSULE | Freq: Every day | ORAL | 0 refills | Status: DC
  Filled 2023-01-06: qty 30, 30d supply, fill #0

## 2023-01-06 MED ORDER — QUETIAPINE FUMARATE 100 MG PO TABS
100.0000 mg | ORAL_TABLET | Freq: Every day | ORAL | 0 refills | Status: AC
  Filled 2023-01-06: qty 30, 30d supply, fill #0

## 2023-01-06 MED ORDER — DIVALPROEX SODIUM ER 500 MG PO TB24
500.0000 mg | ORAL_TABLET | Freq: Every day | ORAL | 0 refills | Status: DC
  Filled 2023-01-06: qty 30, 30d supply, fill #0

## 2023-01-10 ENCOUNTER — Other Ambulatory Visit (HOSPITAL_COMMUNITY): Payer: Self-pay

## 2023-01-10 ENCOUNTER — Other Ambulatory Visit: Payer: Self-pay

## 2023-01-12 ENCOUNTER — Other Ambulatory Visit (HOSPITAL_COMMUNITY): Payer: Self-pay

## 2023-02-15 ENCOUNTER — Other Ambulatory Visit (HOSPITAL_COMMUNITY): Payer: Self-pay

## 2023-02-16 ENCOUNTER — Other Ambulatory Visit (HOSPITAL_COMMUNITY): Payer: Self-pay

## 2023-02-16 MED ORDER — LISDEXAMFETAMINE DIMESYLATE 40 MG PO CAPS
40.0000 mg | ORAL_CAPSULE | Freq: Every day | ORAL | 0 refills | Status: AC
  Filled 2023-02-16: qty 30, 30d supply, fill #0

## 2023-02-16 MED ORDER — DIVALPROEX SODIUM ER 500 MG PO TB24
1000.0000 mg | ORAL_TABLET | Freq: Every day | ORAL | 0 refills | Status: AC
  Filled 2023-02-16: qty 60, 30d supply, fill #0

## 2023-02-16 MED ORDER — QUETIAPINE FUMARATE 50 MG PO TABS
50.0000 mg | ORAL_TABLET | Freq: Every day | ORAL | 0 refills | Status: AC
  Filled 2023-02-16: qty 30, 30d supply, fill #0

## 2023-02-16 MED ORDER — ARIPIPRAZOLE 15 MG PO TABS
15.0000 mg | ORAL_TABLET | Freq: Every day | ORAL | 0 refills | Status: AC
  Filled 2023-02-16: qty 30, 30d supply, fill #0

## 2023-02-17 ENCOUNTER — Other Ambulatory Visit: Payer: Self-pay

## 2023-06-11 ENCOUNTER — Ambulatory Visit (HOSPITAL_COMMUNITY): Payer: BC Managed Care – PPO

## 2024-01-11 IMAGING — CT CT NECK W/ CM
4 of 5 series · 14 of 33 positions shown, 16 images · IV contrast (APPLIED)
Comparison: None.

CLINICAL DATA: Possible right peritonsillar abscess

EXAM:
CT NECK WITH CONTRAST
TECHNIQUE: Multidetector CT imaging of the neck was performed using the
standard protocol following the bolus administration of intravenous
contrast.

[Series 5: axial bone · axial · 0.52mm/px · z∈[-233,-101]mm · 3 of 132 slices shown, 4 images]
[im 33/132  soft-tissue]
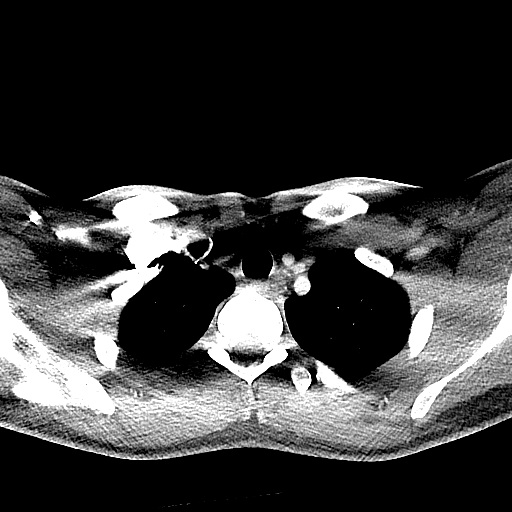
[im 33/132  bone]
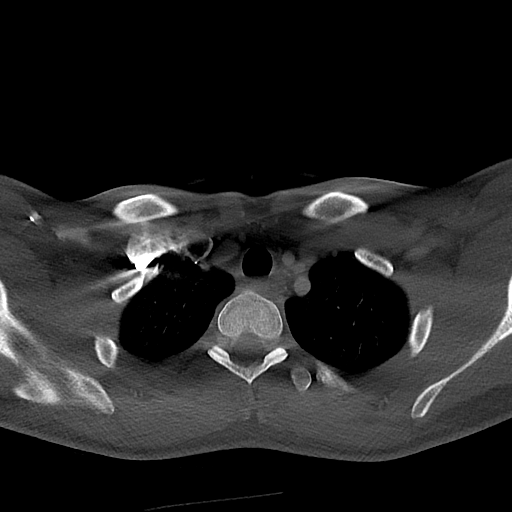
[im 66/132  bone]
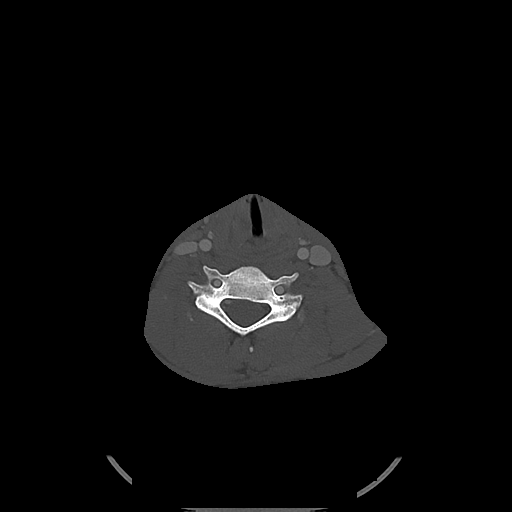
[im 99/132  bone]
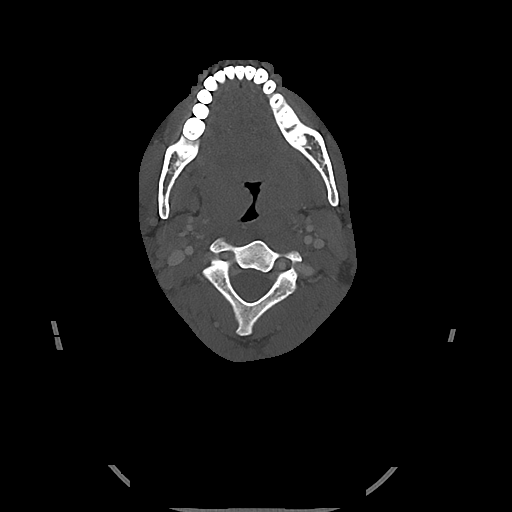

[Series 6: sag neck · sagittal · 0.47mm/px · 5 of 98 slices shown, 6 images]
[im 33/98  bone]
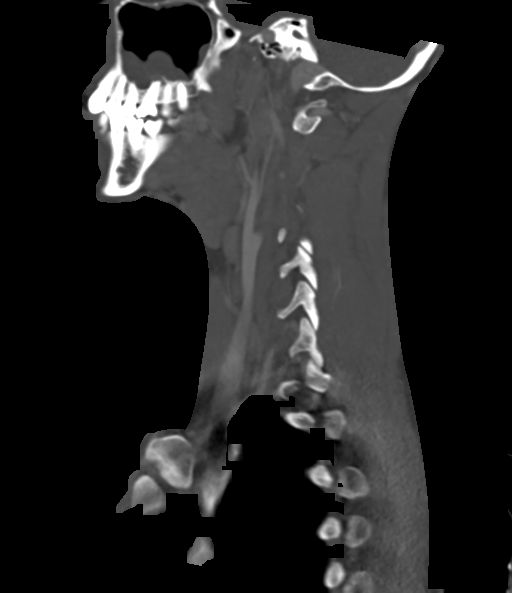
[im 41/98  bone]
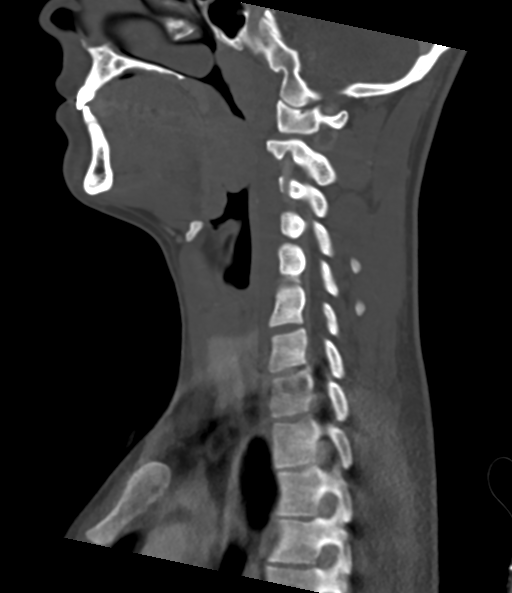
[im 49/98  soft-tissue]
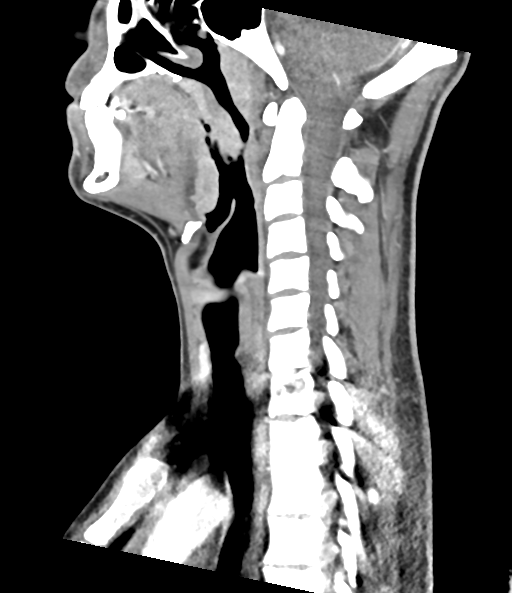
[im 49/98  bone]
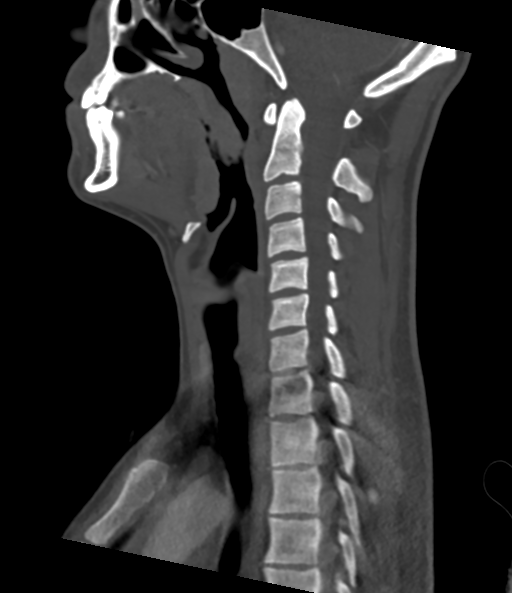
[im 57/98  bone]
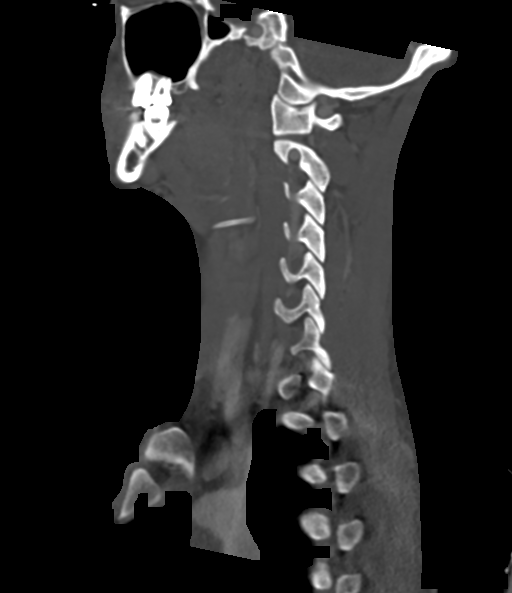
[im 65/98  bone]
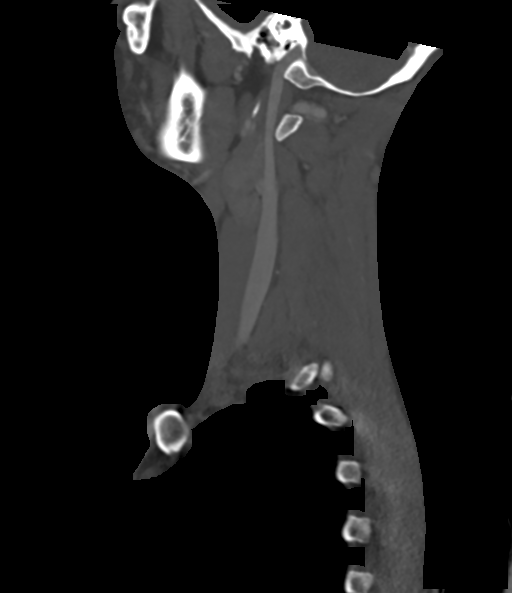

[Series 7: cor neck · coronal · 0.38mm/px · 3 of 97 slices shown]
[im 20/97  bone]
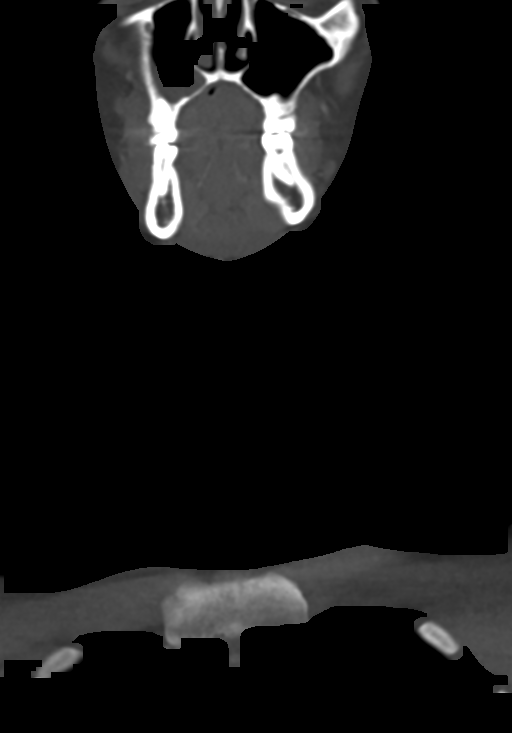
[im 39/97  bone]
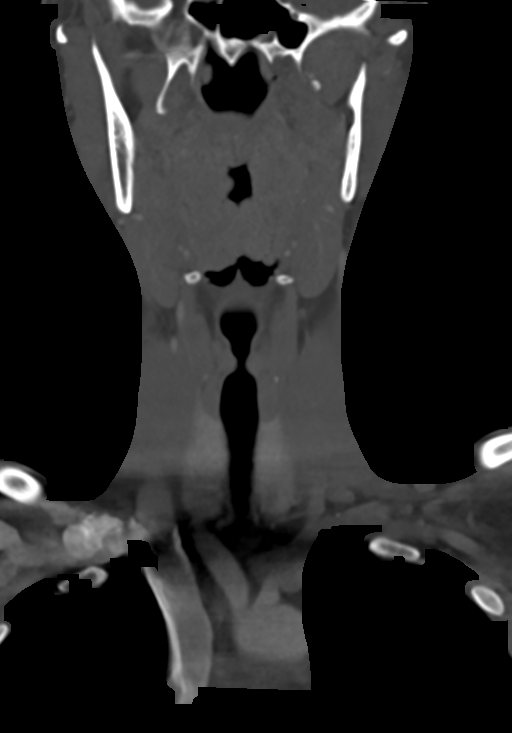
[im 58/97  bone]
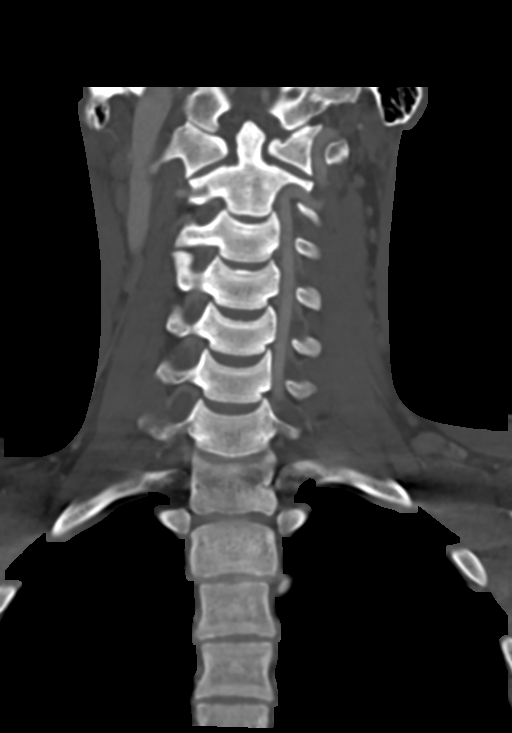

[Series 8: ax oropharynx · axial · 0.38mm/px · z∈[-264,-127]mm · 3 of 140 slices shown]
[im 35/140  bone]
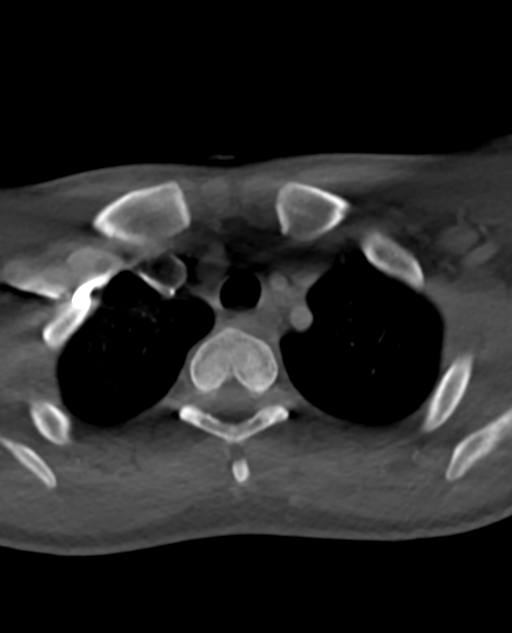
[im 70/140  bone]
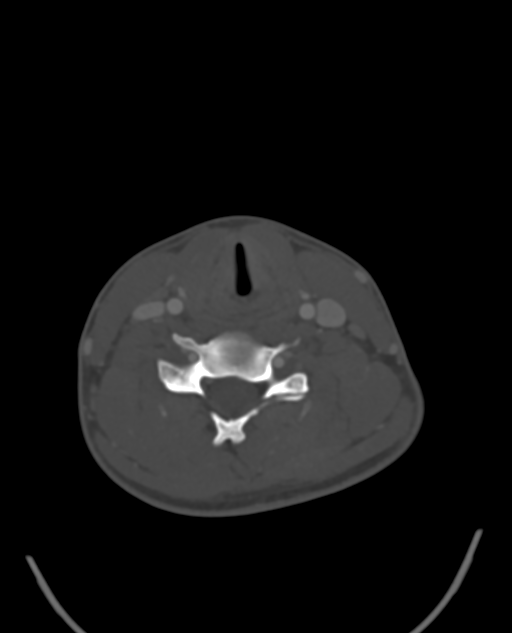
[im 105/140  bone]
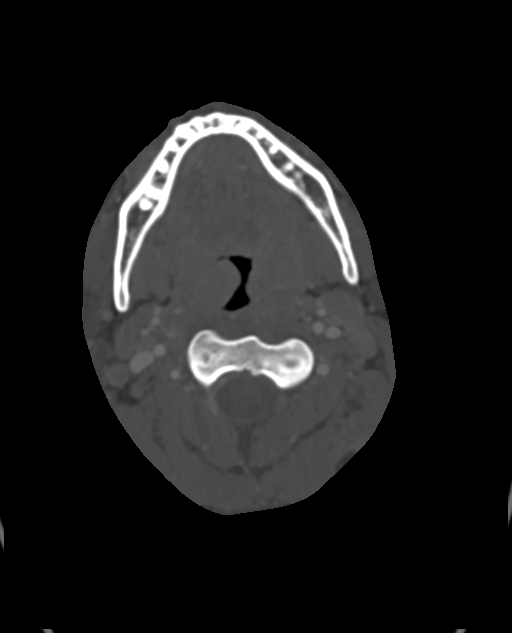

[14 of 33 positions shown; findings below may reference images not displayed]

RADIATION DOSE REDUCTION: This exam was performed according to the
departmental dose-optimization program which includes automated
exposure control, adjustment of the mA and/or kV according to
patient size and/or use of iterative reconstruction technique.

CONTRAST:  75mL OMNIPAQUE IOHEXOL 300 MG/ML  SOLN
FINDINGS: Pharynx and larynx: Tonsillar thickening. No abscess or
retropharyngeal edema. No laryngitis

Salivary glands: No inflammation, mass, or stone.

Thyroid: Normal.

Lymph nodes: Enlarged and somewhat avidly enhancing lymph nodes in
the bilateral jugular chain with mild heterogeneity but no
liquefaction.

Vascular: Unremarkable.  No venous thrombosis.

Limited intracranial: Negative

Visualized orbits: Limited coverage is negative

Mastoids and visualized paranasal sinuses: Lobulated mucosal
thickening in the inferior right maxillary sinus which may be
odontogenic given right upper first molar periapical lucency.

Skeleton: Negative

Upper chest: Negative
IMPRESSION: 1. Tonsillitis and cervical adenitis without abscess.
2. Right maxillary sinusitis which may be odontogenic.

## 2024-01-25 ENCOUNTER — Emergency Department (HOSPITAL_COMMUNITY)
Admission: EM | Admit: 2024-01-25 | Discharge: 2024-01-25 | Disposition: A | Discharge status: Home / Self Care | Attending: Emergency Medicine | Admitting: Emergency Medicine

## 2024-01-25 DIAGNOSIS — R000 Tachycardia, unspecified: Secondary | ICD-10-CM | POA: Insufficient documentation

## 2024-01-25 DIAGNOSIS — E876 Hypokalemia: Secondary | ICD-10-CM | POA: Insufficient documentation

## 2024-01-25 DIAGNOSIS — F1910 Other psychoactive substance abuse, uncomplicated: Secondary | ICD-10-CM

## 2024-01-25 LAB — CBC
HCT: 42.5 % (ref 39.0–52.0)
Hemoglobin: 14 g/dL (ref 13.0–17.0)
MCH: 27.6 pg (ref 26.0–34.0)
MCHC: 32.9 g/dL (ref 30.0–36.0)
MCV: 83.8 fL (ref 80.0–100.0)
Platelets: 477 10*3/uL — ABNORMAL HIGH (ref 150–400)
RBC: 5.07 MIL/uL (ref 4.22–5.81)
RDW: 13.4 % (ref 11.5–15.5)
WBC: 15.3 10*3/uL — ABNORMAL HIGH (ref 4.0–10.5)
nRBC: 0 % (ref 0.0–0.2)

## 2024-01-25 LAB — ETHANOL: Alcohol, Ethyl (B): <15 mg/dL (ref <15)

## 2024-01-25 LAB — COMPREHENSIVE METABOLIC PANEL WITH GFR
ALT: 20 U/L (ref 0–44)
AST: 27 U/L (ref 15–41)
Albumin: 4.3 g/dL (ref 3.5–5.0)
Alkaline Phosphatase: 55 U/L (ref 38–126)
Anion gap: 17 — ABNORMAL HIGH (ref 5–15)
BUN: 11 mg/dL (ref 6–20)
CO2: 14 mmol/L — ABNORMAL LOW (ref 22–32)
Calcium: 9.5 mg/dL (ref 8.9–10.3)
Chloride: 106 mmol/L (ref 98–111)
Creatinine, Ser: 0.99 mg/dL (ref 0.61–1.24)
GFR, Estimated: >60 mL/min (ref >60)
Glucose, Bld: 174 mg/dL — ABNORMAL HIGH (ref 70–99)
Potassium: 2.8 mmol/L — ABNORMAL LOW (ref 3.5–5.1)
Sodium: 137 mmol/L (ref 135–145)
Total Bilirubin: 0.8 mg/dL (ref 0.0–1.2)
Total Protein: 7.6 g/dL (ref 6.5–8.1)

## 2024-01-25 MED ORDER — POTASSIUM CHLORIDE CRYS ER 20 MEQ PO TBCR: 40.0000 meq | EXTENDED_RELEASE_TABLET | Freq: Two times a day (BID) | ORAL | 0 refills | Status: AC

## 2024-01-25 MED ORDER — MAGNESIUM SULFATE 2 GM/50ML IV SOLN
2.0000 g | Freq: Once | INTRAVENOUS | Status: AC
  Administered 2024-01-25: 2 g via INTRAVENOUS
  Filled 2024-01-25: qty 50

## 2024-01-25 MED ORDER — POTASSIUM CHLORIDE 10 MEQ/100ML IV SOLN
10.0000 meq | INTRAVENOUS | Status: AC
  Administered 2024-01-25 (×3): 10 meq via INTRAVENOUS
  Filled 2024-01-25 (×2): qty 100

## 2024-01-25 MED ORDER — ONDANSETRON HCL 4 MG/2ML IJ SOLN
4.0000 mg | Freq: Once | INTRAMUSCULAR | Status: AC
  Administered 2024-01-25: 4 mg via INTRAVENOUS
  Filled 2024-01-25: qty 2

## 2024-01-25 MED ORDER — LACTATED RINGERS IV BOLUS
1000.0000 mL | Freq: Once | INTRAVENOUS | Status: AC
  Administered 2024-01-25: 1000 mL via INTRAVENOUS

## 2024-01-25 NOTE — ED Triage Notes (Signed)
 Pt reports taking THC and crack cocaine PTA - stss this is his first time. Pt is pale and weak. Increasingly less responsive in triage. Taken back to [facility].

## 2024-01-25 NOTE — ED Notes (Signed)
 Unable to obtain temp and complete triage - d/t acute concerns for pt status. Pt taken by to [facility] at this time. PIV obtained and labs sent

## 2024-01-25 NOTE — ED Provider Notes (Signed)
 [place] EMERGENCY DEPARTMENT AT [facility] Provider Note   CSN: [id number] Arrival date & time: 01/25/24  0159     History  Chief Complaint  Patient presents with   Drug Problem    Stanley Castro is a 23 y.o. male.  23 year old male without significant past medical history presents ER today with feeling abnormal after smoking marijuana and doing crack.  Patient states he is a regular marijuana user but has never used crack in the past.  States he just feels weak and nauseous.  Did have some emesis prior to my evaluation.   Drug Problem       Home Medications Prior to Admission medications   Medication Sig Start Date End Date Taking? Authorizing Provider  ARIPiprazole  (ABILIFY ) 15 MG tablet Take 1 tablet (15 mg total) by mouth at bedtime. 02/16/23     ARIPiprazole  (ABILIFY ) 5 MG tablet Take 1 tablet (5 mg total) by mouth daily. 03/03/22   Alver Jobs, MD  divalproex  (DEPAKOTE  ER) 500 MG 24 hr tablet Take 2 tablets (1,000 mg total) by mouth at bedtime. 02/16/23     escitalopram  (LEXAPRO ) 5 MG tablet Take 1 tablet (5 mg total) by mouth daily. 03/03/22   Alver Jobs, MD  hydrOXYzine  (ATARAX ) 25 MG tablet Take 1 tablet (25 mg total) by mouth 3 (three) times daily as needed for anxiety. 03/02/22   Alver Jobs, MD  lisdexamfetamine (VYVANSE ) 40 MG capsule Take 1 capsule (40 mg total) by mouth daily. 02/16/23     melatonin 3 MG TABS tablet Take 1 tablet (3 mg total) by mouth at bedtime. 03/02/22   Alver Jobs, MD  nicotine  (NICODERM CQ  - DOSED IN MG/24 HOURS) 14 mg/24hr patch Place 1 patch (14 mg total) onto the skin daily. 03/03/22   Alver Jobs, MD  QUEtiapine  (SEROQUEL ) 100 MG tablet Take 1 tablet (100 mg total) by mouth at bedtime. 01/06/23     QUEtiapine  (SEROQUEL ) 50 MG tablet Take 1 tablet (50 mg total) by mouth at bedtime. 02/16/23         Allergies    Patient has no known allergies.    Review of Systems   Review of Systems  Physical Exam Updated Vital  Signs BP 110/67   Pulse 81   Resp (!) 23   SpO2 100%  Physical Exam Vitals and nursing note reviewed.  Constitutional:      Appearance: He is well-developed.  HENT:     Head: Normocephalic and atraumatic.  Eyes:     Pupils: Pupils are equal, round, and reactive to light.  Cardiovascular:     Rate and Rhythm: Normal rate.  Pulmonary:     Effort: Pulmonary effort is normal. No respiratory distress.  Abdominal:     General: There is no distension.  Musculoskeletal:        General: Normal range of motion.     Cervical back: Normal range of motion.  Skin:    Coloration: Skin is pale.  Neurological:     General: No focal deficit present.     Mental Status: He is alert.     Comments: Pupils slightly dilated but reactive to light and symmetric.  Moving all extremities.  Face is symmetric.  Tongue protrudes midline.  Sensation intact to lower extremities.  Sleepy but follows commands easily.     ED Results / Procedures / Treatments   Labs (all labs ordered are listed, but only abnormal results are displayed) Labs Reviewed  COMPREHENSIVE METABOLIC PANEL WITH  GFR - Abnormal; Notable for the following components:      Result Value   Potassium 2.8 (*)    CO2 14 (*)    Glucose, Bld 174 (*)    Anion gap 17 (*)    All other components within normal limits  CBC - Abnormal; Notable for the following components:   WBC 15.3 (*)    Platelets 477 (*)    All other components within normal limits  ETHANOL  RAPID URINE DRUG SCREEN, HOSP PERFORMED  MAGNESIUM   LIPASE, BLOOD  TYPE AND SCREEN    EKG None  Radiology No results found.  Procedures .Critical Care  Performed by: Eve Hinders, MD Authorized by: Eve Hinders, MD   Critical care provider statement:    Critical care time (minutes):  30   Critical care was necessary to treat or prevent imminent or life-threatening deterioration of the following conditions:  Metabolic crisis   Critical care was time spent personally by  me on the following activities:  Development of treatment plan with patient or surrogate, discussions with consultants, evaluation of patient's response to treatment, examination of patient, ordering and review of laboratory studies, ordering and review of radiographic studies, ordering and performing treatments and interventions, pulse oximetry, re-evaluation of patient's condition and review of old charts     Medications Ordered in ED Medications  potassium chloride  10 mEq in 100 mL IVPB (has no administration in time range)  magnesium  sulfate IVPB 2 g 50 mL (has no administration in time range)  lactated ringers  bolus 1,000 mL (1,000 mLs Intravenous New Bag/Given 01/25/24 0257)  ondansetron  (ZOFRAN ) injection 4 mg (4 mg Intravenous Given 01/25/24 0254)    ED Course/ Medical Decision Making/ A&P                                 Medical Decision Making Amount and/or Complexity of Data Reviewed Labs: ordered.  Risk Prescription drug management.   Patient is pale slightly tachycardic and sleepy likely related to the drug intake.  Will check basic labs which is throwing up and feeling unwell otherwise allowing him to metabolize his intoxicants.  Ultimately patient found to be pretty profoundly hypokalemic.  Repleted IV.  Patient is feeling much better.  Still slightly tachycardic.  Tolerating p.o.  considering admission however patient wants to go home. I discussed the risk of electrolyte imbalances and will stay to get a few runs of potassium rather than leave AMA but wants to eat.  As I do not foresee any issues he does seem to be get better we will go ahead and let him have a sandwich.  Final Clinical Impression(s) / ED Diagnoses Final diagnoses:  None    Rx / DC Orders ED Discharge Orders     None         Shanicka Oldenkamp, Reymundo Caulk, MD 01/25/24 [phone]

## 2024-06-03 ENCOUNTER — Emergency Department (HOSPITAL_COMMUNITY)

## 2024-06-03 ENCOUNTER — Emergency Department (HOSPITAL_COMMUNITY)
Admission: EM | Admit: 2024-06-03 | Discharge: 2024-06-03 | Disposition: A | Discharge status: Home / Self Care | Attending: Emergency Medicine | Admitting: Emergency Medicine

## 2024-06-03 ENCOUNTER — Encounter (HOSPITAL_COMMUNITY): Payer: Self-pay

## 2024-06-03 ENCOUNTER — Other Ambulatory Visit: Payer: Self-pay

## 2024-06-03 DIAGNOSIS — S01511A Laceration without foreign body of lip, initial encounter: Secondary | ICD-10-CM | POA: Insufficient documentation

## 2024-06-03 DIAGNOSIS — Z23 Encounter for immunization: Secondary | ICD-10-CM | POA: Insufficient documentation

## 2024-06-03 DIAGNOSIS — W540XXA Bitten by dog, initial encounter: Secondary | ICD-10-CM | POA: Insufficient documentation

## 2024-06-03 DIAGNOSIS — S51832A Puncture wound without foreign body of left forearm, initial encounter: Secondary | ICD-10-CM | POA: Diagnosis not present

## 2024-06-03 MED ORDER — BACITRACIN ZINC 500 UNIT/GM EX OINT
TOPICAL_OINTMENT | Freq: Two times a day (BID) | CUTANEOUS | Status: DC
  Administered 2024-06-03: 1 via TOPICAL
  Filled 2024-06-03: qty 0.9
  Filled 2024-06-03: qty 2.7

## 2024-06-03 MED ORDER — TETANUS-DIPHTH-ACELL PERTUSSIS 5-2.5-18.5 LF-MCG/0.5 IM SUSY
0.5000 mL | PREFILLED_SYRINGE | Freq: Once | INTRAMUSCULAR | Status: AC
  Administered 2024-06-03: 0.5 mL via INTRAMUSCULAR
  Filled 2024-06-03: qty 0.5

## 2024-06-03 MED ORDER — AMOXICILLIN-POT CLAVULANATE 875-125 MG PO TABS: 1.0000 | ORAL_TABLET | Freq: Two times a day (BID) | ORAL | 0 refills | Status: AC

## 2024-06-03 MED ORDER — LIDOCAINE HCL (PF) 1 % IJ SOLN
30.0000 mL | Freq: Once | INTRAMUSCULAR | Status: AC
  Administered 2024-06-03: 30 mL
  Filled 2024-06-03: qty 30

## 2024-06-03 MED ORDER — AMOXICILLIN-POT CLAVULANATE 875-125 MG PO TABS
1.0000 | ORAL_TABLET | Freq: Once | ORAL | Status: AC
  Administered 2024-06-03: 1 via ORAL
  Filled 2024-06-03: qty 1

## 2024-06-03 NOTE — ED Notes (Signed)
 Husband Ozell [phone] would like an update asap

## 2024-06-03 NOTE — ED Notes (Signed)
 Patient transported to X-ray

## 2024-06-03 NOTE — ED Triage Notes (Signed)
 Patient arrived by [facility] from home following dog bite to lower lip and left lower arm/ lip wound a partial tear with no bleeding and 2 puncture wounds to left lower arm. The dog is a rescue and he reports has had rabies vaccine. No bleeding, patient appears anxious
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
## 2024-06-03 NOTE — ED Provider Notes (Signed)
 [facility] Emergency Department Provider Note MRN:  [id number]  Arrival date & time: 06/03/24     Chief Complaint   No chief complaint on file.   History of Present Illness   Stanley Castro is a 23 y.o. year-old male presents to the ED with chief complaint of dog bite.  States that he was bitten by his rescue dog.  The bite occurred about 30 minutes ago.  He was bitten on the lip and on the left upper forearm.  He states that the dog is current on its immunizations.  He denies any injuries elsewhere.  He states that he is very sleepy because he did not sleep well last night.  Denies any drugs or alcohol use.  Denies any other associated symptoms..  History provided by patient.   Review of Systems  Pertinent positive and negative review of systems noted in HPI.    Physical Exam   Vitals:   06/03/24 1617  BP: (!) 153/91  Pulse: (!) 107  Resp: 20  Temp: 98.3 F (36.8 C)  SpO2: 99%    CONSTITUTIONAL:  non toxic-appearing, NAD NEURO:  Alert and oriented x 3, CN 3-12 grossly intact EYES:  eyes equal and reactive ENT/NECK:  Supple, no stridor, 1 cm avulsion/flap time laceration to the left lower lip CARDIO:  tachycardic, regular rhythm, appears well-perfused  PULM:  No respiratory distress GI/GU:  non-distended,  MSK/SPINE:  No gross deformities, no edema, moves all extremities, 3 small punctures to the left proximal forearm, no FB noted, bleeding controlled, normal ROM and strength of wrist and elbow SKIN:  no rash, atraumatic   *Additional and/or pertinent findings included in MDM below  Diagnostic and Interventional Summary    EKG Interpretation Date/Time:    Ventricular Rate:    PR Interval:    QRS Duration:    QT Interval:    QTC Calculation:   R Axis:      Text Interpretation:         Labs Reviewed - No data to display  DG Elbow Complete Left  Final Result      Medications  bacitracin  ointment (1 Application Topical Given  06/03/24 1814)  Tdap (BOOSTRIX ) injection 0.5 mL (0.5 mLs Intramuscular Given 06/03/24 1813)  lidocaine  (PF) (XYLOCAINE ) 1 % injection 30 mL (30 mLs Infiltration Given 06/03/24 1804)  amoxicillin -clavulanate (AUGMENTIN ) 875-125 MG per tablet 1 tablet (1 tablet Oral Given 06/03/24 1814)     Procedures  /  Critical Care .Laceration Repair  Date/Time: 06/03/2024 6:20 PM  Performed by: Vicky Charleston, PA-C Authorized by: Vicky Charleston, PA-C   Consent:    Consent obtained:  Verbal   Consent given by:  Patient   Risks, benefits, and alternatives were discussed: yes     Risks discussed:  Infection, pain, poor cosmetic result and poor wound healing   Alternatives discussed:  No treatment Universal protocol:    Procedure explained and questions answered to patient or proxy's satisfaction: yes     Relevant documents present and verified: yes     Test results available: yes     Imaging studies available: yes     Required blood products, implants, devices, and special equipment available: yes     Site/side marked: yes     Immediately prior to procedure, a time out was called: yes     Patient identity confirmed:  Verbally with patient Anesthesia:    Anesthesia method:  Local infiltration   Local anesthetic:  Lidocaine  1% w/o  epi Laceration details:    Location:  Lip   Lip location:  Lower exterior lip   Length (cm):  1 Pre-procedure details:    Preparation:  Patient was prepped and draped in usual sterile fashion Exploration:    Wound exploration: wound explored through full range of motion and entire depth of wound visualized   Treatment:    Area cleansed with:  Saline   Amount of cleaning:  Extensive   Irrigation solution:  Sterile saline Skin repair:    Repair method:  Sutures   Suture size:  5-0   Wound skin closure material used: vicryl rapide.   Suture technique:  Simple interrupted   Number of sutures:  5 Approximation:    Approximation:  Close   Vermilion border  well-aligned: yes   Repair type:    Repair type:  Simple Post-procedure details:    Dressing:  Open (no dressing)   Procedure completion:  Tolerated well, no immediate complications   ED Course and Medical Decision Making  I have reviewed the triage vital signs, the nursing notes, and pertinent available records from the EMR.  Social Determinants Affecting Complexity of Care: Patient has no clinically significant social determinants affecting this chief complaint..   ED Course:    Medical Decision Making Patient here after being bitten by rescue dog.  He states that the dog is up-to-date on its vaccinations.  Will give patient Tdap.  Will treat with Augmentin .  He sustained a lower lip laceration and avulsion.  There was minimal involvement of the vermilion border, but this was nicely reapproximated.  There was a small flap that was also reapproximated.  There was some missing superficial tissue, but this appeared to be quite shallow and should heal fine by secondary intention.  Patient sustained several small puncture wounds to the left forearm.  He has intact range of motion and strength of the left elbow and wrist.  I doubt that the dog bit deep enough to affect his tendon or muscles, but I have given him strict return precautions.  Wounds were appropriately irrigated and dressed with application of bacitracin .  Patient treated with Augmentin  in the ED and discharged home with the same.  I have advised that he follow-up with [facility] and or his primary care doctor.  Amount and/or Complexity of Data Reviewed Radiology: ordered.  Risk OTC drugs. Prescription drug management.         Consultants: No consultations were needed in caring for this patient.   Treatment and Plan: Emergency department workup does not suggest an emergent condition requiring admission or immediate intervention beyond  what has been performed at this time. The patient is safe for discharge and has  been  instructed to return immediately for worsening symptoms, change in  symptoms or any other concerns    Final Clinical Impressions(s) / ED Diagnoses     ICD-10-CM   1. Dog bite, initial encounter  W54.0XXA     2. Lip laceration, initial encounter  S01.511A       ED Discharge Orders          Ordered    amoxicillin -clavulanate (AUGMENTIN ) 875-125 MG tablet  Every 12 hours        06/03/24 1808              Discharge Instructions Discussed with and Provided to Patient:     Discharge Instructions      You need to follow-up with your regular doctor or with a face/plastic surgeon.  The sutures should come out on their own.  The dog bit off a very small portion of the outermost lip tissue.  This will need to heal from the inside out.  Take antibiotics as directed.  Keep your wounds clean and dry.  Monitor for worsening swelling or pain in the elbow or wrist.  If you notice any redness, discharge, fever, or any other symptoms that concern you, you should return to the emergency department.  The sutures will dissolve in about 5 days.  You can use warm salt water rinses for your lip and mouth.  You can apply a very thin layer of Vaseline to the lip for the first day or 2.       Vicky Charleston, PA-C 06/03/24 1821    Pamella Ozell LABOR, DO 06/08/24 1145

## 2024-06-03 NOTE — Discharge Instructions (Addendum)
 You need to follow-up with your regular doctor or with a Government social research officer.  The sutures should come out on their own.  The dog bit off a very small portion of the outermost lip tissue.  This will need to heal from the inside out.  Take antibiotics as directed.  Keep your wounds clean and dry.  Monitor for worsening swelling or pain in the elbow or wrist.  If you notice any redness, discharge, fever, or any other symptoms that concern you, you should return to the emergency department.  The sutures will dissolve in about 5 days.  You can use warm salt water rinses for your lip and mouth.  You can apply a very thin layer of Vaseline to the lip for the first day or 2.
